# Patient Record
Sex: Female | Born: 1935 | Race: Black or African American | Hispanic: No | Marital: Single | Smoking: Never smoker
Health system: Southern US, Community
[De-identification: ages and names within clinical notes are randomized; demographics above are authoritative.]

## PROBLEM LIST (undated history)

## (undated) DIAGNOSIS — E785 Hyperlipidemia, unspecified: Secondary | ICD-10-CM

## (undated) DIAGNOSIS — I10 Essential (primary) hypertension: Secondary | ICD-10-CM

## (undated) DIAGNOSIS — T7840XA Allergy, unspecified, initial encounter: Secondary | ICD-10-CM

## (undated) HISTORY — DX: Hyperlipidemia, unspecified: E78.5

## (undated) HISTORY — PX: BREAST BIOPSY: SHX20

## (undated) HISTORY — DX: Allergy, unspecified, initial encounter: T78.40XA

## (undated) HISTORY — DX: Essential (primary) hypertension: I10

---

## 2005-07-26 HISTORY — PX: ABDOMINAL HYSTERECTOMY: SHX81

## 2008-10-30 ENCOUNTER — Encounter: Payer: Self-pay | Admitting: Cardiovascular Disease

## 2009-02-12 ENCOUNTER — Encounter: Payer: Self-pay | Admitting: Cardiovascular Disease

## 2009-02-14 ENCOUNTER — Encounter: Payer: Self-pay | Admitting: Cardiovascular Disease

## 2009-02-17 ENCOUNTER — Ambulatory Visit: Payer: Self-pay | Admitting: Cardiovascular Disease

## 2009-02-17 DIAGNOSIS — I10 Essential (primary) hypertension: Secondary | ICD-10-CM | POA: Insufficient documentation

## 2009-02-17 DIAGNOSIS — R001 Bradycardia, unspecified: Secondary | ICD-10-CM

## 2009-02-17 DIAGNOSIS — E785 Hyperlipidemia, unspecified: Secondary | ICD-10-CM

## 2009-02-17 DIAGNOSIS — R011 Cardiac murmur, unspecified: Secondary | ICD-10-CM

## 2012-10-24 ENCOUNTER — Encounter: Payer: Self-pay | Admitting: Nurse Practitioner

## 2012-10-26 ENCOUNTER — Other Ambulatory Visit: Payer: Self-pay | Admitting: *Deleted

## 2012-10-26 MED ORDER — RAMIPRIL 5 MG PO CAPS: 5.0000 mg | ORAL_CAPSULE | Freq: Every day | ORAL | Status: DC

## 2012-12-25 ENCOUNTER — Other Ambulatory Visit: Payer: Self-pay | Admitting: Nurse Practitioner

## 2012-12-26 NOTE — Telephone Encounter (Signed)
Last seen 11/13.

## 2013-01-04 ENCOUNTER — Other Ambulatory Visit: Payer: Self-pay | Admitting: *Deleted

## 2013-01-04 MED ORDER — SIMVASTATIN 20 MG PO TABS: 20.0000 mg | ORAL_TABLET | Freq: Every day | ORAL | Status: DC

## 2013-01-25 ENCOUNTER — Other Ambulatory Visit: Payer: Self-pay | Admitting: Nurse Practitioner

## 2013-02-05 ENCOUNTER — Other Ambulatory Visit: Payer: Self-pay | Admitting: Nurse Practitioner

## 2013-02-07 NOTE — Telephone Encounter (Signed)
Last seen 05/30/12 and last lipid 05/30/12 MMM

## 2013-02-21 ENCOUNTER — Other Ambulatory Visit: Payer: Self-pay | Admitting: Nurse Practitioner

## 2013-02-23 NOTE — Telephone Encounter (Signed)
Last seen 05/30/12 MMM

## 2013-03-07 ENCOUNTER — Other Ambulatory Visit: Payer: Self-pay | Admitting: Nurse Practitioner

## 2013-03-27 ENCOUNTER — Other Ambulatory Visit: Payer: Self-pay | Admitting: Nurse Practitioner

## 2013-04-06 ENCOUNTER — Other Ambulatory Visit: Payer: Self-pay | Admitting: Nurse Practitioner

## 2013-04-09 NOTE — Telephone Encounter (Signed)
NTBS for zocor refill

## 2013-04-09 NOTE — Telephone Encounter (Signed)
Last lipids 05/30/12  MMM

## 2013-04-10 ENCOUNTER — Other Ambulatory Visit: Payer: Self-pay | Admitting: Nurse Practitioner

## 2013-04-11 ENCOUNTER — Encounter: Payer: Self-pay | Admitting: Nurse Practitioner

## 2013-04-11 ENCOUNTER — Other Ambulatory Visit: Payer: Self-pay | Admitting: Nurse Practitioner

## 2013-04-11 ENCOUNTER — Ambulatory Visit (INDEPENDENT_AMBULATORY_CARE_PROVIDER_SITE_OTHER): Payer: Medicare HMO | Admitting: Nurse Practitioner

## 2013-04-11 VITALS — BP 128/74 | HR 62 | Temp 97.9°F | Wt 176.0 lb

## 2013-04-11 DIAGNOSIS — E785 Hyperlipidemia, unspecified: Secondary | ICD-10-CM

## 2013-04-11 DIAGNOSIS — E559 Vitamin D deficiency, unspecified: Secondary | ICD-10-CM

## 2013-04-11 DIAGNOSIS — I10 Essential (primary) hypertension: Secondary | ICD-10-CM

## 2013-04-11 MED ORDER — SIMVASTATIN 20 MG PO TABS: 20.0000 mg | ORAL_TABLET | Freq: Every day | ORAL | Status: DC

## 2013-04-11 MED ORDER — RAMIPRIL 5 MG PO CAPS: 5.0000 mg | ORAL_CAPSULE | Freq: Every day | ORAL | Status: DC

## 2013-04-11 NOTE — Patient Instructions (Addendum)

## 2013-04-11 NOTE — Progress Notes (Signed)
  Subjective:    Patient ID: Mariah Fletcher, female    DOB: 1935/07/03, 78 y.o.   MRN: [id number]  Hypertension This is a chronic problem. The current episode started more than 1 year ago. The problem is unchanged. The problem is controlled. Pertinent negatives include no blurred vision, chest pain, headaches, palpitations, peripheral edema or shortness of breath. There are no associated agents to hypertension. Risk factors for coronary artery disease include dyslipidemia, family history and post-menopausal state. Past treatments include ACE inhibitors. The current treatment provides significant improvement. Compliance problems include diet and exercise.  There is no history of CAD/MI or CVA. There is no history of chronic renal disease.  Hyperlipidemia This is a chronic problem. The current episode started more than 1 year ago. The problem is controlled. Recent lipid tests were reviewed and are normal. She has no history of chronic renal disease, diabetes, hypothyroidism, liver disease, obesity or nephrotic syndrome. There are no known factors aggravating her hyperlipidemia. Pertinent negatives include no chest pain, focal sensory loss, focal weakness, leg pain, myalgias or shortness of breath. Current antihyperlipidemic treatment includes statins. The current treatment provides significant improvement of lipids. Compliance problems include adherence to diet and adherence to exercise.  Risk factors for coronary artery disease include hypertension and post-menopausal.  vitamin d deficiency Vitamin D OTC- no side effects   Review of Systems  Eyes: Negative for blurred vision.  Respiratory: Negative for shortness of breath.   Cardiovascular: Negative for chest pain and palpitations.  Musculoskeletal: Negative for myalgias.  Neurological: Negative for focal weakness and headaches.  All other systems reviewed and are negative.       Objective:   Physical Exam  Constitutional: She is oriented to  person, place, and time. She appears well-developed and well-nourished.  HENT:  Nose: Nose normal.  Mouth/Throat: Oropharynx is clear and moist.  Eyes: EOM are normal.  Neck: Trachea normal, normal range of motion and full passive range of motion without pain. Neck supple. No JVD present. Carotid bruit is not present. No thyromegaly present.  Cardiovascular: Normal rate, regular rhythm, normal heart sounds and intact distal pulses.  Exam reveals no gallop and no friction rub.   No murmur heard. Pulmonary/Chest: Effort normal and breath sounds normal.  Abdominal: Soft. Bowel sounds are normal. She exhibits no distension and no mass. There is no tenderness.  Musculoskeletal: Normal range of motion.  Lymphadenopathy:    She has no cervical adenopathy.  Neurological: She is alert and oriented to person, place, and time. She has normal reflexes.  Skin: Skin is warm and dry.  Psychiatric: She has a normal mood and affect. Her behavior is normal. Judgment and thought content normal.    BP 128/74  Pulse 62  Temp(Src) 97.9 F (36.6 C) (Oral)  Wt 176 lb (79.833 kg)       Assessment & Plan:  1. Hypertension Low NA+ diet - CMP14+EGFR - ramipril (ALTACE) 5 MG capsule; Take 1 capsule (5 mg total) by mouth daily.  Dispense: 30 capsule; Refill: 5  2. Hyperlipidemia LDL goal < 100 Low fat diet an dexercise - NMR, lipoprofile - simvastatin (ZOCOR) 20 MG tablet; Take 1 tablet (20 mg total) by mouth at bedtime.  Dispense: 30 tablet; Refill: 5  3. Unspecified vitamin D deficiency  - Vit D  25 hydroxy (rtn osteoporosis monitoring) Health maintenance reviewed  Mary-Margaret Daphine Deutscher, FNP

## 2013-04-13 LAB — CMP14+EGFR
ALT: 15 IU/L (ref 0–32)
Albumin: 4.3 g/dL (ref 3.5–4.8)
Alkaline Phosphatase: 73 IU/L (ref 39–117)
BUN: 11 mg/dL (ref 8–27)
CO2: 25 mmol/L (ref 18–29)
Chloride: 105 mmol/L (ref 97–108)
Glucose: 69 mg/dL (ref 65–99)
Potassium: 5.2 mmol/L (ref 3.5–5.2)
Total Bilirubin: 1.1 mg/dL (ref 0.0–1.2)
Total Protein: 7.5 g/dL (ref 6.0–8.5)

## 2013-04-13 LAB — NMR, LIPOPROFILE
Cholesterol: 130 mg/dL (ref <200)
LDL Particle Number: 584 nmol/L (ref <1000)
LDL Size: 21 nm (ref >20.5)
LDLC SERPL CALC-MCNC: 61 mg/dL (ref <100)
LP-IR Score: 33 (ref <=45)
Triglycerides by NMR: 91 mg/dL (ref <150)

## 2013-04-26 ENCOUNTER — Other Ambulatory Visit: Payer: Self-pay | Admitting: Nurse Practitioner

## 2013-09-22 ENCOUNTER — Other Ambulatory Visit: Payer: Self-pay | Admitting: Nurse Practitioner

## 2013-10-23 ENCOUNTER — Other Ambulatory Visit: Payer: Self-pay | Admitting: Family Medicine

## 2013-10-24 ENCOUNTER — Other Ambulatory Visit: Payer: Self-pay | Admitting: *Deleted

## 2013-10-24 DIAGNOSIS — E785 Hyperlipidemia, unspecified: Secondary | ICD-10-CM

## 2013-10-24 MED ORDER — SIMVASTATIN 20 MG PO TABS: 20.0000 mg | ORAL_TABLET | Freq: Every day | ORAL | Status: DC

## 2013-12-07 ENCOUNTER — Encounter: Payer: Self-pay | Admitting: Nurse Practitioner

## 2013-12-07 ENCOUNTER — Ambulatory Visit (INDEPENDENT_AMBULATORY_CARE_PROVIDER_SITE_OTHER): Payer: Medicare HMO | Admitting: Nurse Practitioner

## 2013-12-07 VITALS — BP 138/81 | HR 55 | Temp 98.1°F | Ht 64.0 in | Wt 179.2 lb

## 2013-12-07 DIAGNOSIS — L723 Sebaceous cyst: Secondary | ICD-10-CM

## 2013-12-07 DIAGNOSIS — E785 Hyperlipidemia, unspecified: Secondary | ICD-10-CM

## 2013-12-07 DIAGNOSIS — I10 Essential (primary) hypertension: Secondary | ICD-10-CM

## 2013-12-07 DIAGNOSIS — Z13820 Encounter for screening for osteoporosis: Secondary | ICD-10-CM

## 2013-12-07 MED ORDER — SIMVASTATIN 20 MG PO TABS: 20.0000 mg | ORAL_TABLET | Freq: Every day | ORAL | Status: DC

## 2013-12-07 MED ORDER — RAMIPRIL 5 MG PO CAPS: 5.0000 mg | ORAL_CAPSULE | Freq: Every day | ORAL | Status: DC

## 2013-12-07 NOTE — Progress Notes (Signed)
Subjective:    Patient ID: Mariah Fletcher, female    DOB: Feb 08, 1935, 79 y.o.   MRN: [id number]  Patient here today for follow up of chronic medical problems.  Hypertension This is a chronic problem. The current episode started more than 1 year ago. The problem is unchanged. The problem is controlled. Pertinent negatives include no blurred vision, chest pain, headaches, palpitations, peripheral edema or shortness of breath. There are no associated agents to hypertension. Risk factors for coronary artery disease include dyslipidemia, family history and post-menopausal state. Past treatments include ACE inhibitors. The current treatment provides significant improvement. Compliance problems include diet and exercise.  There is no history of CAD/MI or CVA. There is no history of chronic renal disease.  Hyperlipidemia This is a chronic problem. The current episode started more than 1 year ago. The problem is controlled. Recent lipid tests were reviewed and are normal. She has no history of chronic renal disease, diabetes, hypothyroidism, liver disease, obesity or nephrotic syndrome. There are no known factors aggravating her hyperlipidemia. Pertinent negatives include no chest pain, focal sensory loss, focal weakness, leg pain, myalgias or shortness of breath. Current antihyperlipidemic treatment includes statins. The current treatment provides significant improvement of lipids. Compliance problems include adherence to diet and adherence to exercise.  Risk factors for coronary artery disease include hypertension and post-menopausal.  vitamin d deficiency Vitamin D OTC- no side effects   Review of Systems  Eyes: Negative for blurred vision.  Respiratory: Negative for shortness of breath.   Cardiovascular: Negative for chest pain and palpitations.  Musculoskeletal: Negative for myalgias.  Neurological: Negative for focal weakness and headaches.  All other systems reviewed and are negative.       Objective:   Physical Exam  Constitutional: She is oriented to person, place, and time. She appears well-developed and well-nourished.  HENT:  Nose: Nose normal.  Mouth/Throat: Oropharynx is clear and moist.  Eyes: EOM are normal.  Neck: Trachea normal, normal range of motion and full passive range of motion without pain. Neck supple. No JVD present. Carotid bruit is not present. No thyromegaly present.  Cardiovascular: Normal rate, regular rhythm, normal heart sounds and intact distal pulses.  Exam reveals no gallop and no friction rub.   No murmur heard. Pulmonary/Chest: Effort normal and breath sounds normal.  Abdominal: Soft. Bowel sounds are normal. She exhibits no distension and no mass. There is no tenderness.  Musculoskeletal: Normal range of motion.  Lymphadenopathy:    She has no cervical adenopathy.  Neurological: She is alert and oriented to person, place, and time. She has normal reflexes.  Skin: Skin is warm and dry.  Large sebaceous cyst right upper back   Psychiatric: She has a normal mood and affect. Her behavior is normal. Judgment and thought content normal.    BP 138/81  Pulse 55  Temp(Src) 98.1 F (36.7 C) (Oral)  Ht '5\' 4"'  (1.626 m)  Wt 179 lb 3.2 oz (81.285 kg)  BMI 30.74 kg/m2       Assessment & Plan:  1. Hyperlipidemia LDL goal < 100 Low fat diet and erercise - NMR, lipoprofile - simvastatin (ZOCOR) 20 MG tablet; Take 1 tablet (20 mg total) by mouth at bedtime.  Dispense: 90 tablet; Refill: 1  2. Hypertension *2low Na+ diet** - CMP14+EGFR - ramipril (ALTACE) 5 MG capsule; Take 1 capsule (5 mg total) by mouth daily.  Dispense: 90 capsule; Refill: 1  3. Sebaceous cyst Make appointment for removal Do not pick  Or scratch at  area  Health maintenance reviewed Labs pending Follow up in 3 months  Bier, FNP

## 2013-12-07 NOTE — Patient Instructions (Signed)

## 2013-12-10 LAB — CMP14+EGFR
A/G RATIO: 1.4 (ref 1.1–2.5)
ALBUMIN: 4.2 g/dL (ref 3.5–4.8)
ALT: 9 IU/L (ref 0–32)
AST: 15 IU/L (ref 0–40)
Alkaline Phosphatase: 63 IU/L (ref 39–117)
BILIRUBIN TOTAL: 0.7 mg/dL (ref 0.0–1.2)
BUN/Creatinine Ratio: 9 — ABNORMAL LOW (ref 11–26)
BUN: 7 mg/dL — ABNORMAL LOW (ref 8–27)
CO2: 27 mmol/L (ref 18–29)
Calcium: 11.5 mg/dL — ABNORMAL HIGH (ref 8.7–10.3)
Chloride: 104 mmol/L (ref 97–108)
Creatinine, Ser: 0.76 mg/dL (ref 0.57–1.00)
GFR, EST AFRICAN AMERICAN: 88 mL/min/{1.73_m2} (ref >59)
GFR, EST NON AFRICAN AMERICAN: 76 mL/min/{1.73_m2} (ref >59)
GLOBULIN, TOTAL: 3.1 g/dL (ref 1.5–4.5)
GLUCOSE: 88 mg/dL (ref 65–99)
POTASSIUM: 5.6 mmol/L — AB (ref 3.5–5.2)
Sodium: 143 mmol/L (ref 134–144)
TOTAL PROTEIN: 7.3 g/dL (ref 6.0–8.5)

## 2013-12-10 LAB — NMR, LIPOPROFILE
CHOLESTEROL: 119 mg/dL (ref 100–199)
HDL Cholesterol by NMR: 57 mg/dL (ref >39)
HDL PARTICLE NUMBER: 27.1 umol/L — AB (ref >=30.5)
LDL Particle Number: <300 nmol/L (ref <1000)
LDLC SERPL CALC-MCNC: 40 mg/dL (ref 0–99)
LP-IR Score: 29 (ref <=45)
SMALL LDL PARTICLE NUMBER: 97 nmol/L (ref <=527)
TRIGLYCERIDES BY NMR: 109 mg/dL (ref 0–149)

## 2013-12-24 ENCOUNTER — Ambulatory Visit (INDEPENDENT_AMBULATORY_CARE_PROVIDER_SITE_OTHER): Payer: Medicare HMO | Admitting: *Deleted

## 2013-12-24 DIAGNOSIS — E875 Hyperkalemia: Secondary | ICD-10-CM

## 2013-12-25 LAB — BMP8+EGFR
BUN / CREAT RATIO: 21 (ref 11–26)
BUN: 14 mg/dL (ref 8–27)
CHLORIDE: 106 mmol/L (ref 97–108)
CO2: 25 mmol/L (ref 18–29)
Calcium: 11.2 mg/dL — ABNORMAL HIGH (ref 8.7–10.3)
Creatinine, Ser: 0.67 mg/dL (ref 0.57–1.00)
GFR calc Af Amer: 99 mL/min/{1.73_m2} (ref >59)
GFR calc non Af Amer: 86 mL/min/{1.73_m2} (ref >59)
Glucose: 89 mg/dL (ref 65–99)
POTASSIUM: 5 mmol/L (ref 3.5–5.2)
SODIUM: 141 mmol/L (ref 134–144)

## 2014-01-16 ENCOUNTER — Encounter: Payer: Self-pay | Admitting: Nurse Practitioner

## 2014-01-16 ENCOUNTER — Ambulatory Visit: Payer: Medicare HMO | Admitting: Nurse Practitioner

## 2014-01-16 ENCOUNTER — Ambulatory Visit (INDEPENDENT_AMBULATORY_CARE_PROVIDER_SITE_OTHER): Payer: Medicare HMO | Admitting: Nurse Practitioner

## 2014-01-16 VITALS — BP 134/80 | HR 45 | Temp 98.7°F | Ht 64.0 in | Wt 179.2 lb

## 2014-01-16 DIAGNOSIS — L723 Sebaceous cyst: Secondary | ICD-10-CM

## 2014-01-16 MED ORDER — CEPHALEXIN 500 MG PO CAPS: 500.0000 mg | ORAL_CAPSULE | Freq: Three times a day (TID) | ORAL | Status: DC

## 2014-01-16 NOTE — Progress Notes (Signed)
   Subjective:    Patient ID: Mariah Fletcher, female    DOB: 07/06/1935, 79 y.o.   MRN: [id number]  HPI Patient here today for removal of sebaceous cYst on back- Has bee there for awhile and has been getting larger. Rubs on her bra strap.    Review of Systems  All other systems reviewed and are negative.      Objective:   Physical Exam  Constitutional: She is oriented to person, place, and time. She appears well-developed and well-nourished. No distress.  Cardiovascular: Normal rate and normal heart sounds.   Pulmonary/Chest: Effort normal and breath sounds normal.  Neurological: She is alert and oriented to person, place, and time.  Skin:  3 cm cystic lesion with central blackened area in center.  Psychiatric: She has a normal mood and affect. Her behavior is normal. Judgment and thought content normal.    Procedure;  Lidocaine 2% with - 4ml local  Cleaned with betadine  #11 blade to make incision  Copious amounts of foul smelling blck cheesy like substance removed  Iodoform packing   tegaderm dressing      Assessment & Plan:   1. Sebaceous cyst    Wound care discussed Meds ordered this encounter  Medications  . cephALEXin (KEFLEX) 500 MG capsule    Sig: Take 1 capsule (500 mg total) by mouth 3 (three) times daily.    Dispense:  30 capsule    Refill:  0    Order Specific Question:  Supervising Provider    Answer:  Deborra MedinaMOORE, DONALD W [[id number]]   RTO MOnday to change packing  Mary-Margaret Daphine DeutscherMartin, FNP

## 2014-01-21 ENCOUNTER — Ambulatory Visit (INDEPENDENT_AMBULATORY_CARE_PROVIDER_SITE_OTHER): Payer: Medicare HMO | Admitting: Nurse Practitioner

## 2014-01-21 ENCOUNTER — Encounter: Payer: Self-pay | Admitting: Nurse Practitioner

## 2014-01-21 VITALS — BP 132/76 | HR 53 | Temp 97.9°F | Ht 64.0 in | Wt 180.2 lb

## 2014-01-21 DIAGNOSIS — Z4800 Encounter for change or removal of nonsurgical wound dressing: Secondary | ICD-10-CM

## 2014-01-21 NOTE — Progress Notes (Signed)
   Subjective:    Patient ID: Mariah Fletcher, female    DOB: 1934/08/04, 79 y.o.   MRN: [id number]  HPI Patient had a large sebaceous cyst removed form mid upper back last week and is here today to have it repacked.    Review of Systems  Constitutional: Negative.   HENT: Negative.   Respiratory: Negative.   Cardiovascular: Negative.   Genitourinary: Negative.   Neurological: Negative.   Psychiatric/Behavioral: Negative.   All other systems reviewed and are negative.      Objective:   Physical Exam  Constitutional: She appears well-developed and well-nourished.  Cardiovascular: Normal rate, regular rhythm and normal heart sounds.   Pulmonary/Chest: Effort normal and breath sounds normal.  Skin: Skin is warm.  Wound clean- no sign of infection- changed packing today    BP 132/76  Pulse 53  Temp(Src) 97.9 F (36.6 C) (Oral)  Ht 5\' 4"  (1.626 m)  Wt 180 lb 3.2 oz (81.738 kg)  BMI 30.92 kg/m2       Assessment & Plan:   1. Change or removal of wound packing    Patient to pull a small amount of packing out daily Keep covered Follow up in 1 week  Mariah Daphine DeutscherMartin, FNP

## 2014-01-21 NOTE — Progress Notes (Signed)
   Subjective:    Patient ID: Mariah Fletcher, female    DOB: [birth year], 79 y.o.   MRN: [id number]  HPI Labs only   Review of Systems     Objective:   Physical Exam        Assessment & Plan:

## 2014-01-31 ENCOUNTER — Encounter: Payer: Self-pay | Admitting: Nurse Practitioner

## 2014-01-31 ENCOUNTER — Ambulatory Visit (INDEPENDENT_AMBULATORY_CARE_PROVIDER_SITE_OTHER): Payer: Medicare HMO | Admitting: Nurse Practitioner

## 2014-01-31 VITALS — BP 134/80 | HR 53 | Temp 98.7°F | Ht 64.0 in | Wt 177.0 lb

## 2014-01-31 DIAGNOSIS — T07XXXA Unspecified multiple injuries, initial encounter: Secondary | ICD-10-CM

## 2014-01-31 DIAGNOSIS — T148XXD Other injury of unspecified body region, subsequent encounter: Secondary | ICD-10-CM

## 2014-01-31 NOTE — Progress Notes (Signed)
   Subjective:    Patient ID: Mariah Fletcher, female    DOB: 07/19/1935, 79 y.o.   MRN: [id number]  HPI Patient in for recheck of removal of sebaceous cyst that has had packing in it. The packing completely came out on Tuesday of this week. SHe says that it not bothering her an dthat there has been no drainage.    Review of Systems  All other systems reviewed and are negative.      Objective:   Physical Exam  Constitutional: She appears well-developed and well-nourished.  Cardiovascular: Normal rate, regular rhythm and normal heart sounds.   Pulmonary/Chest: Effort normal and breath sounds normal.  Skin: Skin is warm and dry.  Wound filling in nicely without any sign of infection.   Procedure- cleaned with peroxide and bandaid applied       Assessment & Plan:   1. Delayed wound healing    Continue to clean daily with antibacterial soap Keep covered Watch for signs of infection RTO prn  Mary-Margaret Daphine DeutscherMartin, FNP

## 2014-01-31 NOTE — Patient Instructions (Signed)
Continue to clean daily with antibacterial soap Keep covered Watch for signs of infection RTO prn

## 2014-02-20 ENCOUNTER — Ambulatory Visit (INDEPENDENT_AMBULATORY_CARE_PROVIDER_SITE_OTHER): Payer: Medicare HMO | Admitting: Pharmacist

## 2014-02-20 ENCOUNTER — Encounter: Payer: Self-pay | Admitting: Pharmacist

## 2014-02-20 ENCOUNTER — Ambulatory Visit (INDEPENDENT_AMBULATORY_CARE_PROVIDER_SITE_OTHER): Payer: Medicare HMO

## 2014-02-20 VITALS — Ht 61.0 in | Wt 176.0 lb

## 2014-02-20 DIAGNOSIS — M8580 Other specified disorders of bone density and structure, unspecified site: Secondary | ICD-10-CM | POA: Insufficient documentation

## 2014-02-20 DIAGNOSIS — M899 Disorder of bone, unspecified: Secondary | ICD-10-CM

## 2014-02-20 DIAGNOSIS — M949 Disorder of cartilage, unspecified: Secondary | ICD-10-CM

## 2014-02-20 DIAGNOSIS — Z13820 Encounter for screening for osteoporosis: Secondary | ICD-10-CM

## 2014-02-20 MED ORDER — VITAMIN D 1000 UNITS PO TABS: 1000.0000 [IU] | ORAL_TABLET | Freq: Every day | ORAL | Status: AC

## 2014-02-20 NOTE — Progress Notes (Addendum)
Patient ID: Mariah Fletcher, female   DOB: [birth year], 79 y.o.   MRN: [id number] [facility] Current Height: Height: 5\' 1"  (154.9 cm)      Max Lifetime Height:  5\' 2"  Current Weight: Weight: 176 lb (79.833 kg)       Ethnicity:African American  BP:       HR:         HPI: Does pt already have a diagnosis of:  Osteopenia?  Yes Osteoporosis?  No  Back Pain?  No       Kyphosis?  No Prior fracture?  No Med(s) for Osteoporosis/Osteopenia:  none Med(s) previously tried for Osteoporosis/Osteopenia:  none                                                             PMH: Age at menopause:  Last 5940's  Hysterectomy?  Yes Oophorectomy?  Yes HRT? No Steroid Use?  No Thyroid med?  No History of cancer?  No History of digestive disorders (ie Crohn's)?  Yes Current or previous eating disorders?  No Last Vitamin D Result:  37.9 (04/11/2013) Last GFR Result:  88 (12/07/2013) Last Calcium Result:  11.2 (elevated (01/07/2014) - down from 11.5 (12/07/2013)   FH/SH: Family history of osteoporosis?  Yes - sister Parent with history of hip fracture?  No Family history of breast cancer? No Exercise?  No - not regularly but 1-2 times per week she walks Smoking?  No Alcohol?  No    Calcium Assessment Calcium Intake  # of servings/day  Calcium mg  Milk (8 oz) 0  x  300  = 0  Yogurt (4 oz) 1 x  200 = 200mg   Cheese (1 oz) 1 x  200 = 200mg   Other Calcium sources   250mg   Ca supplement 0 = 0   Estimated calcium intake per day 650mg    **patient has history of hypercalcemia** DEXA Results Date of Test T-Score for AP Spine L1-L4 T-Score for Total Left Hip T-Score Neck of left hip T-Score for Total Right Hip T-Score Neck of Right Hip  02/20/2014 -0.3 -0.2 -1.4 -0.3 -1.0  03/17/2011 -0.5 0.0 -1.3 -0.1 -0.8  12/13/2005 -0.6 0.6 -0.6 0.3 -0.3                 FRAX 10 year estimate: Total FX risk:  11%  (consider medication if >/= 20%) Hip FX risk:  2.2%  (consider medication if >/=  3%)  Assessment: Osteopenia - low fracture risk hypercalcemia  Recommendations: 1.  Discussed DEXA results and fracture risk 2.  recommend no change in current calcium intake until can further evaluate cause of hypercalcemia   3.  recommend weight bearing exercise - 30 minutes at least 4 days per week.   4.  Counseled and educated about fall risk and prevention. 5. Decrease vitamin D to 1000IU daily due to elevated calcium 6.  Orders Placed This Encounter  Procedures  . PTH, Intact and Calcium     Recheck DEXA:  2 years  Time spent counseling patient:  25 minutes  Henrene Pastorammy Mariah Fletcher, PharmD, CPP         Labs showed continued hypercalcemia and hyperparathyroidism.  This condition was last evaluated in 2008 by Dr Talmage NapBalan.   Referral to endocrinologist made for re-evaluation.

## 2014-02-20 NOTE — Patient Instructions (Signed)
Decreased vitamin D to 1000 units daily (or 2000 units every other day until you finish what you have at home)               Exercise for Strong Bones  Exercise is important to build and maintain strong bones / bone density.  There are 2 types of exercises that are important to building and maintaining strong bones:  Weight- bearing and muscle-stregthening.  Weight-bearing Exercises  These exercises include activities that make you move against gravity while staying upright. Weight-bearing exercises can be high-impact or low-impact.  High-impact weight-bearing exercises help build bones and keep them strong. If you have broken a bone due to osteoporosis or are at risk of breaking a bone, you may need to avoid high-impact exercises. If you're not sure, you should check with your healthcare provider.  Examples of high-impact weight-bearing exercises are: Dancing  Doing high-impact aerobics  Hiking  Jogging/running  Jumping Rope  Stair climbing  Tennis  Low-impact weight-bearing exercises can also help keep bones strong and are a safe alternative if you cannot do high-impact exercises.   Examples of low-impact weight-bearing exercises are: Using elliptical training machines  Doing low-impact aerobics  Using stair-step machines  Fast walking on a treadmill or outside   Muscle-Strengthening Exercises These exercises include activities where you move your body, a weight or some other resistance against gravity. They are also known as resistance exercises and include: Lifting weights  Using elastic exercise bands  Using weight machines  Lifting your own body weight  Functional movements, such as standing and rising up on your toes  Yoga and Pilates can also improve strength, balance and flexibility. However, certain positions may not be safe for people with osteoporosis or those at increased risk of broken bones. For example, exercises that have you bend forward may increase the chance  of breaking a bone in the spine.   Non-Impact Exercises There are other types of exercises that can help prevent falls.  Non-impact exercises can help you to improve balance, posture and how well you move in everyday activities. Some of these exercises include: Balance exercises that strengthen your legs and test your balance, such as Tai Chi, can decrease your risk of falls.  Posture exercises that improve your posture and reduce rounded or "sloping" shoulders can help you decrease the chance of breaking a bone, especially in the spine.  Functional exercises that improve how well you move can help you with everyday activities and decrease your chance of falling and breaking a bone. For example, if you have trouble getting up from a chair or climbing stairs, you should do these activities as exercises.   **A physical therapist can teach you balance, posture and functional exercises. He/she can also help you learn which exercises are safe and appropriate for you.  [facility] has a physical therapy office in [place] in front of our office and referrals can be made for assessments and treatment as needed and strength and balance training.  If you would like to have an assessment with [facility] and our physical therapy team please let a nurse or provider know.  Fall Prevention and Home Safety Falls cause injuries and can affect all age groups. It is possible to use preventive measures to significantly decrease the likelihood of falls. There are many simple measures which can make your home safer and prevent falls. OUTDOORS  Repair cracks and edges of walkways and driveways.  Remove high doorway thresholds.  Trim shrubbery on the  main path into your home.  Have good outside lighting.  Clear walkways of tools, rocks, debris, and clutter.  Check that handrails are not broken and are securely fastened. Both sides of steps should have handrails.  Have leaves, snow, and ice cleared regularly.  Use  sand or salt on walkways during winter months.  In the garage, clean up grease or oil spills. BATHROOM  Install night lights.  Install grab bars by the toilet and in the tub and shower.  Use non-skid mats or decals in the tub or shower.  Place a plastic non-slip stool in the shower to sit on, if needed.  Keep floors dry and clean up all water on the floor immediately.  Remove soap buildup in the tub or shower on a regular basis.  Secure bath mats with non-slip, double-sided rug tape.  Remove throw rugs and tripping hazards from the floors. BEDROOMS  Install night lights.  Make sure a bedside light is easy to reach.  Do not use oversized bedding.  Keep a telephone by your bedside.  Have a firm chair with side arms to use for getting dressed.  Remove throw rugs and tripping hazards from the floor. KITCHEN  Keep handles on pots and pans turned toward the center of the stove. Use back burners when possible.  Clean up spills quickly and allow time for drying.  Avoid walking on wet floors.  Avoid hot utensils and knives.  Position shelves so they are not too high or low.  Place commonly used objects within easy reach.  If necessary, use a sturdy step stool with a grab bar when reaching.  Keep electrical cables out of the way.  Do not use floor polish or wax that makes floors slippery. If you must use wax, use non-skid floor wax.  Remove throw rugs and tripping hazards from the floor. STAIRWAYS  Never leave objects on stairs.  Place handrails on both sides of stairways and use them. Fix any loose handrails. Make sure handrails on both sides of the stairways are as long as the stairs.  Check carpeting to make sure it is firmly attached along stairs. Make repairs to worn or loose carpet promptly.  Avoid placing throw rugs at the top or bottom of stairways, or properly secure the rug with carpet tape to prevent slippage. Get rid of throw rugs, if possible.  Have  an electrician put in a light switch at the top and bottom of the stairs. OTHER FALL PREVENTION TIPS  Wear low-heel or rubber-soled shoes that are supportive and fit well. Wear closed toe shoes.  When using a stepladder, make sure it is fully opened and both spreaders are firmly locked. Do not climb a closed stepladder.  Add color or contrast paint or tape to grab bars and handrails in your home. Place contrasting color strips on first and last steps.  Learn and use mobility aids as needed. Install an electrical emergency response system.  Turn on lights to avoid dark areas. Replace light bulbs that burn out immediately. Get light switches that glow.  Arrange furniture to create clear pathways. Keep furniture in the same place.  Firmly attach carpet with non-skid or double-sided tape.  Eliminate uneven floor surfaces.  Select a carpet pattern that does not visually hide the edge of steps.  Be aware of all pets. OTHER HOME SAFETY TIPS  Set the water temperature for 120 F (48.8 C).  Keep emergency numbers on or near the telephone.  Keep smoke detectors  on every level of the home and near sleeping areas. Document Released: 07/02/2002 Document Revised: 01/11/2012 Document Reviewed: 10/01/2011 [facility] Patient Information 2015 [place], [place]. This information is not intended to replace advice given to you by your health care provider. Make sure you discuss any questions you have with your health care provider.

## 2014-02-21 LAB — PTH, INTACT AND CALCIUM
CALCIUM: 11.4 mg/dL — AB (ref 8.7–10.3)
PTH: 67 pg/mL — ABNORMAL HIGH (ref 15–65)

## 2014-02-28 ENCOUNTER — Encounter: Payer: Self-pay | Admitting: Pharmacist

## 2014-02-28 ENCOUNTER — Other Ambulatory Visit: Payer: Self-pay | Admitting: Pharmacist

## 2014-02-28 DIAGNOSIS — E210 Primary hyperparathyroidism: Secondary | ICD-10-CM

## 2014-02-28 DIAGNOSIS — M8580 Other specified disorders of bone density and structure, unspecified site: Secondary | ICD-10-CM

## 2014-03-11 ENCOUNTER — Encounter: Payer: Self-pay | Admitting: Nurse Practitioner

## 2014-03-11 ENCOUNTER — Ambulatory Visit (INDEPENDENT_AMBULATORY_CARE_PROVIDER_SITE_OTHER): Payer: Medicare HMO | Admitting: Nurse Practitioner

## 2014-03-11 VITALS — BP 120/78 | Temp 96.9°F | Ht 61.0 in | Wt 177.0 lb

## 2014-03-11 DIAGNOSIS — I10 Essential (primary) hypertension: Secondary | ICD-10-CM

## 2014-03-11 DIAGNOSIS — E785 Hyperlipidemia, unspecified: Secondary | ICD-10-CM

## 2014-03-11 DIAGNOSIS — M949 Disorder of cartilage, unspecified: Secondary | ICD-10-CM

## 2014-03-11 DIAGNOSIS — Z6833 Body mass index (BMI) 33.0-33.9, adult: Secondary | ICD-10-CM

## 2014-03-11 DIAGNOSIS — E210 Primary hyperparathyroidism: Secondary | ICD-10-CM

## 2014-03-11 DIAGNOSIS — M899 Disorder of bone, unspecified: Secondary | ICD-10-CM

## 2014-03-11 DIAGNOSIS — M8580 Other specified disorders of bone density and structure, unspecified site: Secondary | ICD-10-CM

## 2014-03-11 DIAGNOSIS — Z713 Dietary counseling and surveillance: Secondary | ICD-10-CM

## 2014-03-11 NOTE — Progress Notes (Signed)
Subjective:    Patient ID: Mariah Fletcher, female    DOB: [birth year], 79 y.o.   MRN: [id number]  Patient here today for follow up of chronic medical problems. SHe has  No complaints today  Hypertension This is a chronic problem. The current episode started more than 1 year ago. The problem is unchanged. The problem is controlled. Pertinent negatives include no blurred vision, chest pain, headaches, palpitations, peripheral edema or shortness of breath. There are no associated agents to hypertension. Risk factors for coronary artery disease include dyslipidemia, family history and post-menopausal state. Past treatments include ACE inhibitors. The current treatment provides significant improvement. Compliance problems include diet and exercise.  There is no history of CAD/MI or CVA. There is no history of chronic renal disease.  Hyperlipidemia This is a chronic problem. The current episode started more than 1 year ago. The problem is controlled. Recent lipid tests were reviewed and are normal. She has no history of chronic renal disease, diabetes, hypothyroidism, liver disease, obesity or nephrotic syndrome. There are no known factors aggravating her hyperlipidemia. Pertinent negatives include no chest pain, focal sensory loss, focal weakness, leg pain, myalgias or shortness of breath. Current antihyperlipidemic treatment includes statins. The current treatment provides significant improvement of lipids. Compliance problems include adherence to diet and adherence to exercise.  Risk factors for coronary artery disease include hypertension and post-menopausal.  vitamin d deficiency Vitamin D OTC- no side effects hyperparathyroidism On no meds- just watching     Review of Systems  Eyes: Negative for blurred vision.  Respiratory: Negative for shortness of breath.   Cardiovascular: Negative for chest pain and palpitations.  Musculoskeletal: Negative for myalgias.  Neurological: Negative for focal  weakness and headaches.  All other systems reviewed and are negative.      Objective:   Physical Exam  Constitutional: She is oriented to person, place, and time. She appears well-developed and well-nourished.  HENT:  Nose: Nose normal.  Mouth/Throat: Oropharynx is clear and moist.  Eyes: EOM are normal.  Neck: Trachea normal, normal range of motion and full passive range of motion without pain. Neck supple. No JVD present. Carotid bruit is not present. No thyromegaly present.  Cardiovascular: Normal rate, regular rhythm, normal heart sounds and intact distal pulses.  Exam reveals no gallop and no friction rub.   No murmur heard. Pulmonary/Chest: Effort normal and breath sounds normal.  Abdominal: Soft. Bowel sounds are normal. She exhibits no distension and no mass. There is no tenderness.  Musculoskeletal: Normal range of motion.  Lymphadenopathy:    She has no cervical adenopathy.  Neurological: She is alert and oriented to person, place, and time. She has normal reflexes.  Skin: Skin is warm and dry.     Psychiatric: She has a normal mood and affect. Her behavior is normal. Judgment and thought content normal.    BP 120/78  Temp(Src) 96.9 F (36.1 C) (Oral)  Ht '5\' 1"'  (1.549 m)  Wt 177 lb (80.287 kg)  BMI 33.46 kg/m2       Assessment & Plan:   1. Osteopenia   2. Hyperparathyroidism, primary   3. Hyperlipidemia with target LDL less than 100   4. Hypercalcemia   5. Essential hypertension, benign   6. BMI 33.0-33.9,adult   7. Weight loss counseling, encounter for    Orders Placed This Encounter  Procedures  . CMP14+EGFR  . NMR, lipoprofile    Labs pending Health maintenance reviewed Diet and exercise encouraged Continue all meds Follow up  In  3 months    [place], FNP

## 2014-03-11 NOTE — Patient Instructions (Signed)

## 2014-03-12 LAB — CMP14+EGFR
ALBUMIN: 4.3 g/dL (ref 3.5–4.8)
ALT: 9 IU/L (ref 0–32)
AST: 13 IU/L (ref 0–40)
Albumin/Globulin Ratio: 1.4 (ref 1.1–2.5)
Alkaline Phosphatase: 64 IU/L (ref 39–117)
BILIRUBIN TOTAL: 0.8 mg/dL (ref 0.0–1.2)
BUN/Creatinine Ratio: 14 (ref 11–26)
BUN: 10 mg/dL (ref 8–27)
CHLORIDE: 107 mmol/L (ref 97–108)
CO2: 23 mmol/L (ref 18–29)
CREATININE: 0.73 mg/dL (ref 0.57–1.00)
Calcium: 11.4 mg/dL — ABNORMAL HIGH (ref 8.7–10.3)
GFR calc non Af Amer: 80 mL/min/{1.73_m2} (ref >59)
GFR, EST AFRICAN AMERICAN: 92 mL/min/{1.73_m2} (ref >59)
GLOBULIN, TOTAL: 3.1 g/dL (ref 1.5–4.5)
GLUCOSE: 91 mg/dL (ref 65–99)
Potassium: 5.4 mmol/L — ABNORMAL HIGH (ref 3.5–5.2)
Sodium: 144 mmol/L (ref 134–144)
Total Protein: 7.4 g/dL (ref 6.0–8.5)

## 2014-03-12 LAB — NMR, LIPOPROFILE
Cholesterol: 151 mg/dL (ref 100–199)
HDL Cholesterol by NMR: 59 mg/dL (ref >39)
HDL PARTICLE NUMBER: 30.4 umol/L — AB (ref >=30.5)
LDL PARTICLE NUMBER: 426 nmol/L (ref <1000)
LDL Size: 21.1 nm (ref >20.5)
LDLC SERPL CALC-MCNC: 67 mg/dL (ref 0–99)
LP-IR Score: 41 (ref <=45)
Small LDL Particle Number: 96 nmol/L (ref <=527)
TRIGLYCERIDES BY NMR: 126 mg/dL (ref 0–149)

## 2014-03-26 ENCOUNTER — Encounter: Payer: Self-pay | Admitting: Endocrinology

## 2014-03-26 ENCOUNTER — Ambulatory Visit (INDEPENDENT_AMBULATORY_CARE_PROVIDER_SITE_OTHER): Payer: Commercial Managed Care - HMO | Admitting: Endocrinology

## 2014-03-26 VITALS — BP 136/92 | HR 51 | Temp 98.2°F | Ht 61.0 in | Wt 177.0 lb

## 2014-03-26 DIAGNOSIS — E210 Primary hyperparathyroidism: Secondary | ICD-10-CM

## 2014-03-26 DIAGNOSIS — E042 Nontoxic multinodular goiter: Secondary | ICD-10-CM

## 2014-03-26 LAB — TSH: TSH: 2.26 u[IU]/mL (ref 0.350–4.500)

## 2014-03-26 LAB — T4, FREE: Free T4: 0.99 ng/dL (ref 0.80–1.80)

## 2014-03-26 NOTE — Progress Notes (Signed)
Subjective:    Patient ID: Mariah Fletcher, female    DOB: 1934/11/17, 79 y.o.   MRN: [id number]  HPI Pt was first noted to have hypercalcemia in approx 2008.  she has never had urolithiasis, PUD, pancreatitis, osteoprorosis, depression, or bony fracture.  she does not takes vitamin-D, 1000 units per day.  She does not take vitamin-A supplement.  She reports moderate pain at the knees, but no assoc numbness.   Past Medical History  Diagnosis Date  . Hyperlipidemia   . Hypertension   . Allergy     No past surgical history on file.  History   Social History  . Marital Status: Single    Spouse Name: N/A    Number of Children: N/A  . Years of Education: N/A   Occupational History  . Not on file.   Social History Main Topics  . Smoking status: Never Smoker   . Smokeless tobacco: Not on file  . Alcohol Use: Not on file  . Drug Use: Not on file  . Sexual Activity: Not on file   Other Topics Concern  . Not on file   Social History Narrative  . No narrative on file    Current Outpatient Prescriptions on File Prior to Visit  Medication Sig Dispense Refill  . aspirin 81 MG tablet Take 81 mg by mouth daily.      . cholecalciferol (VITAMIN D) 1000 UNITS tablet Take 1 tablet (1,000 Units total) by mouth daily.      . ramipril (ALTACE) 5 MG capsule Take 1 capsule (5 mg total) by mouth daily.  90 capsule  1  . simvastatin (ZOCOR) 20 MG tablet Take 1 tablet (20 mg total) by mouth at bedtime.  90 tablet  1   No current facility-administered medications on file prior to visit.    No Known Allergies  Family History  Problem Relation Age of Onset  . Hyperparathyroidism      neg fam hx    BP 136/92  Pulse 51  Temp(Src) 98.2 F (36.8 C) (Oral)  Ht  (1.549 m)  Wt 177 lb (80.287 kg)  BMI 33.46 kg/m2  SpO2 97%   Review of Systems denies weight loss, galactorrhea, hematuria, memory loss, menopausal sxs, seizure, abdominal pain, muscle weakness, urinary frequency, skin  rash, visual loss, sob, diarrhea, rhinorrhea, easy bruising, and depression.     Objective:   Physical Exam VS: see vs page GEN: no distress HEAD: head: no deformity eyes: no periorbital swelling, no proptosis external nose and ears are normal mouth: no lesion seen NECK: multinodular goiter is noted.  CHEST WALL: no deformity LUNGS:  Clear to auscultation CV: reg rate and rhythm, no murmur ABD: abdomen is soft, nontender.  no hepatosplenomegaly.  not distended.  no hernia MUSCULOSKELETAL: muscle bulk and strength are grossly normal.  no obvious joint swelling.  gait is normal and steady.  No deformity EXTEMITIES: no deformity.  no ulcer on the feet.  feet are of normal color and temp.  no edema PULSES: dorsalis pedis intact bilat.  no carotid bruit NEURO:  cn 2-12 grossly intact.   readily moves all 4's.  sensation is intact to touch on the feet SKIN:  Normal texture and temperature.  No rash or suspicious lesion is visible.   NODES:  None palpable at the neck PSYCH: alert, well-oriented.  Does not appear anxious nor depressed.  Lab Results  Component Value Date   PTH 99* 03/26/2014   CALCIUM 11.1* 03/26/2014  i have reviewed the following outside records: Office notes  Radiology: i reviewed dexa from 7/15:     Assessment & Plan:  Primary hyperparathyroidism, worse.  In view of her h/o Ca++ over 12, she should consider surgery. Multinodular goiter, suggested by physical exam, new.  This needs to be investigated prior to any neck surgery. Knee pain: this could be caused by hyperparathyroidism.     Patient is advised the following: Patient Instructions  blood tests are being requested for you today.  We'll contact you with results. Let's also check the ultrasound.  you will receive a phone call, about a day and time for an appointment

## 2014-03-26 NOTE — Patient Instructions (Addendum)
blood tests are being requested for you today.  We'll contact you with results. Let's also check the ultrasound.  you will receive a phone call, about a day and time for an appointment 

## 2014-03-27 ENCOUNTER — Telehealth: Payer: Self-pay

## 2014-03-27 DIAGNOSIS — E049 Nontoxic goiter, unspecified: Secondary | ICD-10-CM

## 2014-03-27 LAB — PTH, INTACT AND CALCIUM
CALCIUM: 11.1 mg/dL — AB (ref 8.4–10.5)
PTH: 99 pg/mL — AB (ref 14–64)

## 2014-03-27 LAB — VITAMIN D 25 HYDROXY (VIT D DEFICIENCY, FRACTURES): Vit D, 25-Hydroxy: 42 ng/mL (ref 30–89)

## 2014-03-27 NOTE — Telephone Encounter (Signed)
Patient said she Went to Dr Everardo All and he said she needed a Thyroid US and that she needed to contact us about that   She does have [employer] which would have to be pre certed through [place]

## 2014-04-02 ENCOUNTER — Other Ambulatory Visit: Payer: Self-pay | Admitting: Nurse Practitioner

## 2014-04-03 ENCOUNTER — Telehealth: Payer: Self-pay

## 2014-04-03 NOTE — Telephone Encounter (Signed)
[facility] called concerning [place] that was ordered. Pt had requested the [place] be done in [place]. Pt had to request order be placed from her PCP. Order from PCP has been placed and they will proceed with scheduling pt. After test our office will be contacted with results. [facility] wanted MD to be notified.

## 2014-04-05 ENCOUNTER — Other Ambulatory Visit (HOSPITAL_COMMUNITY): Payer: Commercial Managed Care - HMO

## 2014-04-10 ENCOUNTER — Ambulatory Visit (HOSPITAL_COMMUNITY)
Admission: RE | Admit: 2014-04-10 | Discharge: 2014-04-10 | Disposition: A | Discharge status: Home / Self Care | Payer: Medicare HMO | Source: Ambulatory Visit | Attending: Nurse Practitioner | Admitting: Nurse Practitioner

## 2014-04-10 DIAGNOSIS — E049 Nontoxic goiter, unspecified: Secondary | ICD-10-CM | POA: Diagnosis present

## 2014-08-02 ENCOUNTER — Other Ambulatory Visit: Payer: Self-pay | Admitting: Nurse Practitioner

## 2014-10-16 ENCOUNTER — Other Ambulatory Visit: Payer: Self-pay | Admitting: Nurse Practitioner

## 2014-10-29 ENCOUNTER — Other Ambulatory Visit: Payer: Self-pay | Admitting: Nurse Practitioner

## 2014-11-18 ENCOUNTER — Encounter: Payer: Self-pay | Admitting: Nurse Practitioner

## 2014-11-18 ENCOUNTER — Ambulatory Visit (INDEPENDENT_AMBULATORY_CARE_PROVIDER_SITE_OTHER): Payer: Commercial Managed Care - HMO | Admitting: Nurse Practitioner

## 2014-11-18 VITALS — BP 130/78 | HR 49 | Temp 97.1°F | Ht 61.0 in | Wt 180.0 lb

## 2014-11-18 DIAGNOSIS — Z23 Encounter for immunization: Secondary | ICD-10-CM | POA: Diagnosis not present

## 2014-11-18 DIAGNOSIS — E785 Hyperlipidemia, unspecified: Secondary | ICD-10-CM | POA: Diagnosis not present

## 2014-11-18 DIAGNOSIS — I10 Essential (primary) hypertension: Secondary | ICD-10-CM

## 2014-11-18 DIAGNOSIS — E210 Primary hyperparathyroidism: Secondary | ICD-10-CM

## 2014-11-18 MED ORDER — SIMVASTATIN 20 MG PO TABS: 20.0000 mg | ORAL_TABLET | Freq: Every day | ORAL | Status: DC

## 2014-11-18 MED ORDER — RAMIPRIL 5 MG PO CAPS: ORAL_CAPSULE | ORAL | Status: DC

## 2014-11-18 NOTE — Addendum Note (Signed)
Addended by: Cleda DaubUCKER, Mahari Strahm G on: 11/18/2014 01:52 PM   Modules accepted: Orders

## 2014-11-18 NOTE — Patient Instructions (Signed)
Exercise to Stay Healthy Exercise helps you become and stay healthy. EXERCISE IDEAS AND TIPS Choose exercises that:  You enjoy.  Fit into your day. You do not need to exercise really hard to be healthy. You can do exercises at a slow or medium level and stay healthy. You can:  Stretch before and after working out.  Try yoga, Pilates, or tai chi.  Lift weights.  Walk fast, swim, jog, run, climb stairs, bicycle, dance, or rollerskate.  Take aerobic classes. Exercises that burn about 150 calories:  Running 1  miles in 15 minutes.  Playing volleyball for 45 to 60 minutes.  Washing and waxing a car for 45 to 60 minutes.  Playing touch football for 45 minutes.  Walking 1  miles in 35 minutes.  Pushing a stroller 1  miles in 30 minutes.  Playing basketball for 30 minutes.  Raking leaves for 30 minutes.  Bicycling 5 miles in 30 minutes.  Walking 2 miles in 30 minutes.  Dancing for 30 minutes.  Shoveling snow for 15 minutes.  Swimming laps for 20 minutes.  Walking up stairs for 15 minutes.  Bicycling 4 miles in 15 minutes.  Gardening for 30 to 45 minutes.  Jumping rope for 15 minutes.  Washing windows or floors for 45 to 60 minutes. Document Released: 08/14/2010 Document Revised: 10/04/2011 Document Reviewed: 08/14/2010 [employer] Patient Information 2015 [employer]. This information is not intended to replace advice given to you by your health care provider. Make sure you discuss any questions you have with your health care provider.  

## 2014-11-18 NOTE — Progress Notes (Signed)
Subjective:    Patient ID: Mariah Fletcher, female    DOB: Jan 19, 1935, 80 y.o.   MRN: [id number]  Patient here today for follow up of chronic medical problems. SHe has  No complaints today  Hypertension This is a chronic problem. The current episode started more than 1 year ago. The problem is unchanged. The problem is controlled. Pertinent negatives include no chest pain, headaches, palpitations or shortness of breath. Risk factors for coronary artery disease include dyslipidemia, obesity, post-menopausal state and sedentary lifestyle. Past treatments include ACE inhibitors. The current treatment provides moderate improvement. Compliance problems include diet and exercise.   Hyperlipidemia This is a chronic problem. The current episode started more than 1 year ago. The problem is controlled. Recent lipid tests were reviewed and are normal. Exacerbating diseases include obesity. She has no history of diabetes or hypothyroidism. Pertinent negatives include no chest pain, myalgias or shortness of breath. Current antihyperlipidemic treatment includes statins. The current treatment provides moderate improvement of lipids. Compliance problems include adherence to diet and adherence to exercise.  Risk factors for coronary artery disease include dyslipidemia, hypertension, obesity and post-menopausal.  vitamin d deficiency Vitamin D OTC- no side effects hyperparathyroidism On no meds- just watching     Review of Systems  Constitutional: Negative.   HENT: Negative.   Respiratory: Negative for shortness of breath.   Cardiovascular: Negative for chest pain and palpitations.  Genitourinary: Negative.        Denies urinary incontinence  Musculoskeletal: Negative for myalgias.  Neurological: Negative for headaches.  Psychiatric/Behavioral: Negative.   All other systems reviewed and are negative.      Objective:   Physical Exam  Constitutional: She is oriented to person, place, and time. She appears  well-developed and well-nourished.  HENT:  Nose: Nose normal.  Mouth/Throat: Oropharynx is clear and moist.  Eyes: EOM are normal.  Neck: Trachea normal, normal range of motion and full passive range of motion without pain. Neck supple. No JVD present. Carotid bruit is not present. No thyromegaly present.  Cardiovascular: Normal rate, regular rhythm, normal heart sounds and intact distal pulses.  Exam reveals no gallop and no friction rub.   No murmur heard. Pulmonary/Chest: Effort normal and breath sounds normal.  Abdominal: Soft. Bowel sounds are normal. She exhibits no distension and no mass. There is no tenderness.  Musculoskeletal: Normal range of motion.  Lymphadenopathy:    She has no cervical adenopathy.  Neurological: She is alert and oriented to person, place, and time. She has normal reflexes.  Skin: Skin is warm and dry.     Psychiatric: She has a normal mood and affect. Her behavior is normal. Judgment and thought content normal.    BP 130/78 mmHg  Pulse 49  Temp(Src) 97.1 F (36.2 C) (Oral)  Ht $R'5\' 1"'KX$  (1.549 m)  Wt 180 lb (81.647 kg)  BMI 34.03 kg/m2       Assessment & Plan:    1. Essential hypertension, benign Do not add salt to diet - ramipril (ALTACE) 5 MG capsule; TAKE 1 CAPSULE (5 MG TOTAL) BY MOUTH DAILY.  Dispense: 90 capsule; Refill: 0 - CMP14+EGFR  2. Hyperparathyroidism, primary Keep follow up appointment with specialist  3. Hyperlipidemia with target LDL less than 100 Low fat diet - simvastatin (ZOCOR) 20 MG tablet; Take 1 tablet (20 mg total) by mouth at bedtime.  Dispense: 90 tablet; Refill: 0 - NMR, lipoprofile   prevnar 13 today Labs pending Health maintenance reviewed Diet and exercise encouraged Continue all meds  Follow up  In 3 months   [place], FNP

## 2014-11-19 LAB — CMP14+EGFR
A/G RATIO: 1.4 (ref 1.1–2.5)
ALK PHOS: 63 IU/L (ref 39–117)
ALT: 10 IU/L (ref 0–32)
AST: 14 IU/L (ref 0–40)
Albumin: 4.1 g/dL (ref 3.5–4.8)
BUN/Creatinine Ratio: 14 (ref 11–26)
BUN: 9 mg/dL (ref 8–27)
Bilirubin Total: 0.7 mg/dL (ref 0.0–1.2)
CALCIUM: 11.1 mg/dL — AB (ref 8.7–10.3)
CO2: 28 mmol/L (ref 18–29)
CREATININE: 0.64 mg/dL (ref 0.57–1.00)
Chloride: 104 mmol/L (ref 97–108)
GFR calc non Af Amer: 86 mL/min/{1.73_m2} (ref >59)
GFR, EST AFRICAN AMERICAN: 100 mL/min/{1.73_m2} (ref >59)
GLUCOSE: 94 mg/dL (ref 65–99)
Globulin, Total: 3 g/dL (ref 1.5–4.5)
Potassium: 5.2 mmol/L (ref 3.5–5.2)
SODIUM: 142 mmol/L (ref 134–144)
TOTAL PROTEIN: 7.1 g/dL (ref 6.0–8.5)

## 2014-11-19 LAB — NMR, LIPOPROFILE
Cholesterol: 119 mg/dL (ref 100–199)
HDL CHOLESTEROL BY NMR: 54 mg/dL (ref >39)
HDL Particle Number: 26.6 umol/L — ABNORMAL LOW (ref >=30.5)
LDL Particle Number: <300 nmol/L (ref <1000)
LDL-C: 44 mg/dL (ref 0–99)
LP-IR Score: 32 (ref <=45)
Small LDL Particle Number: <90 nmol/L (ref <=527)
Triglycerides by NMR: 106 mg/dL (ref 0–149)

## 2014-12-18 DIAGNOSIS — Z0100 Encounter for examination of eyes and vision without abnormal findings: Secondary | ICD-10-CM | POA: Diagnosis not present

## 2014-12-18 DIAGNOSIS — H5203 Hypermetropia, bilateral: Secondary | ICD-10-CM | POA: Diagnosis not present

## 2014-12-18 DIAGNOSIS — H524 Presbyopia: Secondary | ICD-10-CM | POA: Diagnosis not present

## 2014-12-18 DIAGNOSIS — H2513 Age-related nuclear cataract, bilateral: Secondary | ICD-10-CM | POA: Diagnosis not present

## 2014-12-18 DIAGNOSIS — H43813 Vitreous degeneration, bilateral: Secondary | ICD-10-CM | POA: Diagnosis not present

## 2015-02-11 ENCOUNTER — Other Ambulatory Visit: Payer: Self-pay | Admitting: Nurse Practitioner

## 2015-02-24 ENCOUNTER — Encounter: Payer: Self-pay | Admitting: Nurse Practitioner

## 2015-02-24 ENCOUNTER — Ambulatory Visit (INDEPENDENT_AMBULATORY_CARE_PROVIDER_SITE_OTHER): Payer: Commercial Managed Care - HMO

## 2015-02-24 ENCOUNTER — Ambulatory Visit (INDEPENDENT_AMBULATORY_CARE_PROVIDER_SITE_OTHER): Payer: Commercial Managed Care - HMO | Admitting: Nurse Practitioner

## 2015-02-24 ENCOUNTER — Encounter (INDEPENDENT_AMBULATORY_CARE_PROVIDER_SITE_OTHER): Payer: Self-pay

## 2015-02-24 VITALS — BP 132/88 | HR 51 | Temp 98.0°F | Ht 61.0 in | Wt 176.4 lb

## 2015-02-24 DIAGNOSIS — E785 Hyperlipidemia, unspecified: Secondary | ICD-10-CM

## 2015-02-24 DIAGNOSIS — I10 Essential (primary) hypertension: Secondary | ICD-10-CM

## 2015-02-24 DIAGNOSIS — Z6833 Body mass index (BMI) 33.0-33.9, adult: Secondary | ICD-10-CM | POA: Insufficient documentation

## 2015-02-24 DIAGNOSIS — E210 Primary hyperparathyroidism: Secondary | ICD-10-CM | POA: Diagnosis not present

## 2015-02-24 DIAGNOSIS — M8580 Other specified disorders of bone density and structure, unspecified site: Secondary | ICD-10-CM | POA: Diagnosis not present

## 2015-02-24 MED ORDER — RAMIPRIL 5 MG PO CAPS: ORAL_CAPSULE | ORAL | Status: DC

## 2015-02-24 MED ORDER — SIMVASTATIN 20 MG PO TABS: ORAL_TABLET | ORAL | Status: DC

## 2015-02-24 NOTE — Progress Notes (Signed)
Subjective:    Patient ID: Mariah Fletcher, female    DOB: 1935/03/15, 80 y.o.   MRN: [id number]  Patient here today for follow up of chronic medical problems. SHe has  No complaints today  Hypertension This is a chronic problem. The current episode started more than 1 year ago. The problem is unchanged. The problem is controlled. Pertinent negatives include no chest pain, headaches, palpitations or shortness of breath. Risk factors for coronary artery disease include dyslipidemia, obesity, post-menopausal state and sedentary lifestyle. Past treatments include ACE inhibitors. The current treatment provides moderate improvement. Compliance problems include diet and exercise.   Hyperlipidemia This is a chronic problem. The current episode started more than 1 year ago. The problem is controlled. Recent lipid tests were reviewed and are normal. Exacerbating diseases include obesity. She has no history of diabetes or hypothyroidism. Pertinent negatives include no chest pain, myalgias or shortness of breath. Current antihyperlipidemic treatment includes statins. The current treatment provides moderate improvement of lipids. Compliance problems include adherence to diet and adherence to exercise.  Risk factors for coronary artery disease include dyslipidemia, hypertension, obesity and post-menopausal.  vitamin d deficiency Vitamin D OTC- no side effects hyperparathyroidism On no meds- just watching- calcium has been slightly elevated in past     Review of Systems  Constitutional: Negative.   HENT: Negative.   Respiratory: Negative for shortness of breath.   Cardiovascular: Negative for chest pain and palpitations.  Genitourinary: Negative.        Denies urinary incontinence  Musculoskeletal: Negative for myalgias.  Neurological: Negative for headaches.  Psychiatric/Behavioral: Negative.   All other systems reviewed and are negative.      Objective:   Physical Exam  Constitutional: She is  oriented to person, place, and time. She appears well-developed and well-nourished.  HENT:  Nose: Nose normal.  Mouth/Throat: Oropharynx is clear and moist.  Eyes: EOM are normal.  Neck: Trachea normal, normal range of motion and full passive range of motion without pain. Neck supple. No JVD present. Carotid bruit is not present. No thyromegaly present.  Cardiovascular: Normal rate, regular rhythm, normal heart sounds and intact distal pulses.  Exam reveals no gallop and no friction rub.   No murmur heard. Pulmonary/Chest: Effort normal and breath sounds normal.  Abdominal: Soft. Bowel sounds are normal. She exhibits no distension and no mass. There is no tenderness.  Musculoskeletal: Normal range of motion.  Lymphadenopathy:    She has no cervical adenopathy.  Neurological: She is alert and oriented to person, place, and time. She has normal reflexes.  Skin: Skin is warm and dry.     Psychiatric: She has a normal mood and affect. Her behavior is normal. Judgment and thought content normal.   BP 132/88 mmHg  Pulse 51  Temp(Src) 98 F (36.7 C) (Oral)  Ht _0  (1.549 m)  Wt 176 lb 6.4 oz (80.015 kg)  BMI 33.35 kg/m2  EKG- sinus bradycardia-Mary-Margaret Hassell Done, FNP   Chest x ray- no cardiopulmoary problems  Noted-Preliminary reading by Ronnald Collum, FNP  [facility]        Assessment & Plan:    1. Essential hypertension, benign Do not add salt to diet - DG Chest 2 View; Future - EKG 12-Lead - CMP14+EGFR - ramipril (ALTACE) 5 MG capsule; TAKE 1 CAPSULE (5 MG TOTAL) BY MOUTH DAILY.  Dispense: 90 capsule; Refill: 1  2. Hyperparathyroidism, primary  3. Osteopenia Weight bearing exercises encouraged  4. Hyperlipidemia with target LDL less than 100 Low fat diet -  Lipid panel - simvastatin (ZOCOR) 20 MG tablet; TAKE 1 TABLET (20 MG TOTAL) BY MOUTH AT BEDTIME.  Dispense: 90 tablet; Refill: 1  5. Hypercalcemia Decrease calcium consumption in diet  6. BMI  33.0-33.9,adult Discussed diet and exercise for person with BMI >25 Will recheck weight in 3-6 months     Labs pending Health maintenance reviewed Diet and exercise encouraged Continue all meds Follow up  In 3 months   [place], FNP

## 2015-02-24 NOTE — Patient Instructions (Signed)

## 2015-02-25 ENCOUNTER — Other Ambulatory Visit: Payer: Self-pay | Admitting: *Deleted

## 2015-02-25 DIAGNOSIS — E875 Hyperkalemia: Secondary | ICD-10-CM

## 2015-02-25 LAB — LIPID PANEL
Chol/HDL Ratio: 2 ratio units (ref 0.0–4.4)
Cholesterol, Total: 122 mg/dL (ref 100–199)
HDL: 61 mg/dL (ref >39)
LDL Calculated: 45 mg/dL (ref 0–99)
TRIGLYCERIDES: 78 mg/dL (ref 0–149)
VLDL Cholesterol Cal: 16 mg/dL (ref 5–40)

## 2015-02-25 LAB — CMP14+EGFR
ALK PHOS: 70 IU/L (ref 39–117)
ALT: 12 IU/L (ref 0–32)
AST: 12 IU/L (ref 0–40)
Albumin/Globulin Ratio: 1.2 (ref 1.1–2.5)
Albumin: 4.2 g/dL (ref 3.5–4.8)
BILIRUBIN TOTAL: 1.2 mg/dL (ref 0.0–1.2)
BUN / CREAT RATIO: 17 (ref 11–26)
BUN: 12 mg/dL (ref 8–27)
CO2: 25 mmol/L (ref 18–29)
Calcium: 11.5 mg/dL — ABNORMAL HIGH (ref 8.7–10.3)
Chloride: 103 mmol/L (ref 97–108)
Creatinine, Ser: 0.69 mg/dL (ref 0.57–1.00)
GFR calc Af Amer: 96 mL/min/{1.73_m2} (ref >59)
GFR calc non Af Amer: 84 mL/min/{1.73_m2} (ref >59)
Globulin, Total: 3.6 g/dL (ref 1.5–4.5)
Glucose: 92 mg/dL (ref 65–99)
Potassium: 5.6 mmol/L — ABNORMAL HIGH (ref 3.5–5.2)
SODIUM: 142 mmol/L (ref 134–144)
Total Protein: 7.8 g/dL (ref 6.0–8.5)

## 2015-03-04 ENCOUNTER — Other Ambulatory Visit (INDEPENDENT_AMBULATORY_CARE_PROVIDER_SITE_OTHER): Payer: Commercial Managed Care - HMO

## 2015-03-04 DIAGNOSIS — E875 Hyperkalemia: Secondary | ICD-10-CM

## 2015-03-04 NOTE — Progress Notes (Signed)
Lab only 

## 2015-03-05 LAB — CMP14+EGFR
A/G RATIO: 1.2 (ref 1.1–2.5)
ALT: 13 IU/L (ref 0–32)
AST: 13 IU/L (ref 0–40)
Albumin: 4.2 g/dL (ref 3.5–4.8)
Alkaline Phosphatase: 63 IU/L (ref 39–117)
BUN/Creatinine Ratio: 15 (ref 11–26)
BUN: 10 mg/dL (ref 8–27)
Bilirubin Total: 0.7 mg/dL (ref 0.0–1.2)
CALCIUM: 11.5 mg/dL — AB (ref 8.7–10.3)
CHLORIDE: 101 mmol/L (ref 97–108)
CO2: 26 mmol/L (ref 18–29)
Creatinine, Ser: 0.67 mg/dL (ref 0.57–1.00)
GFR calc Af Amer: 97 mL/min/{1.73_m2} (ref >59)
GFR, EST NON AFRICAN AMERICAN: 84 mL/min/{1.73_m2} (ref >59)
Globulin, Total: 3.5 g/dL (ref 1.5–4.5)
Glucose: 98 mg/dL (ref 65–99)
Potassium: 4.7 mmol/L (ref 3.5–5.2)
SODIUM: 142 mmol/L (ref 134–144)
Total Protein: 7.7 g/dL (ref 6.0–8.5)

## 2015-04-14 DIAGNOSIS — Z1231 Encounter for screening mammogram for malignant neoplasm of breast: Secondary | ICD-10-CM | POA: Diagnosis not present

## 2015-05-01 ENCOUNTER — Encounter: Payer: Self-pay | Admitting: Family Medicine

## 2015-08-21 ENCOUNTER — Encounter: Payer: Self-pay | Admitting: Nurse Practitioner

## 2015-08-21 ENCOUNTER — Ambulatory Visit (INDEPENDENT_AMBULATORY_CARE_PROVIDER_SITE_OTHER): Payer: Commercial Managed Care - HMO | Admitting: Nurse Practitioner

## 2015-08-21 VITALS — BP 135/72 | HR 47 | Temp 97.1°F | Ht 61.0 in | Wt 187.0 lb

## 2015-08-21 DIAGNOSIS — E210 Primary hyperparathyroidism: Secondary | ICD-10-CM | POA: Diagnosis not present

## 2015-08-21 DIAGNOSIS — Z6833 Body mass index (BMI) 33.0-33.9, adult: Secondary | ICD-10-CM | POA: Diagnosis not present

## 2015-08-21 DIAGNOSIS — I10 Essential (primary) hypertension: Secondary | ICD-10-CM

## 2015-08-21 DIAGNOSIS — M8580 Other specified disorders of bone density and structure, unspecified site: Secondary | ICD-10-CM

## 2015-08-21 DIAGNOSIS — E785 Hyperlipidemia, unspecified: Secondary | ICD-10-CM | POA: Diagnosis not present

## 2015-08-21 MED ORDER — RAMIPRIL 5 MG PO CAPS: ORAL_CAPSULE | ORAL | Status: DC

## 2015-08-21 MED ORDER — SIMVASTATIN 20 MG PO TABS: ORAL_TABLET | ORAL | Status: DC

## 2015-08-21 NOTE — Progress Notes (Signed)
Subjective:    Patient ID: Mariah Fletcher, female    DOB: 08/06/1934, 81 y.o.   MRN: [id number]  Patient here today for follow up of chronic medical problems.  Outpatient Encounter Prescriptions as of 08/21/2015  Medication Sig  . aspirin 81 MG tablet Take 81 mg by mouth daily.  . cholecalciferol (VITAMIN D) 1000 UNITS tablet Take 1 tablet (1,000 Units total) by mouth daily.  . ramipril (ALTACE) 5 MG capsule TAKE 1 CAPSULE (5 MG TOTAL) BY MOUTH DAILY.  . simvastatin (ZOCOR) 20 MG tablet TAKE 1 TABLET (20 MG TOTAL) BY MOUTH AT BEDTIME.   No facility-administered encounter medications on file as of 08/21/2015.     Hypertension This is a chronic problem. The current episode started more than 1 year ago. The problem is unchanged. The problem is controlled. Pertinent negatives include no chest pain, headaches, palpitations or shortness of breath. Risk factors for coronary artery disease include dyslipidemia, obesity, post-menopausal state and sedentary lifestyle. Past treatments include ACE inhibitors. The current treatment provides moderate improvement. Compliance problems include diet and exercise.   Hyperlipidemia This is a chronic problem. The current episode started more than 1 year ago. The problem is controlled. Recent lipid tests were reviewed and are normal. Exacerbating diseases include obesity. She has no history of diabetes or hypothyroidism. Pertinent negatives include no chest pain, myalgias or shortness of breath. Current antihyperlipidemic treatment includes statins. The current treatment provides moderate improvement of lipids. Compliance problems include adherence to diet and adherence to exercise.  Risk factors for coronary artery disease include dyslipidemia, hypertension, obesity and post-menopausal.  vitamin d deficiency Vitamin D OTC- no side effects hyperparathyroidism On no meds- just watching- calcium has been slightly elevated in past     Review of Systems   Constitutional: Negative.   HENT: Negative.   Respiratory: Negative for shortness of breath.   Cardiovascular: Negative for chest pain and palpitations.  Genitourinary: Negative.        Denies urinary incontinence  Musculoskeletal: Negative for myalgias.  Neurological: Negative for headaches.  Psychiatric/Behavioral: Negative.   All other systems reviewed and are negative.      Objective:   Physical Exam  Constitutional: She is oriented to person, place, and time. She appears well-developed and well-nourished.  HENT:  Nose: Nose normal.  Mouth/Throat: Oropharynx is clear and moist.  Eyes: EOM are normal.  Neck: Trachea normal, normal range of motion and full passive range of motion without pain. Neck supple. No JVD present. Carotid bruit is not present. No thyromegaly present.  Cardiovascular: Normal rate, regular rhythm, normal heart sounds and intact distal pulses.  Exam reveals no gallop and no friction rub.   No murmur heard. Pulmonary/Chest: Effort normal and breath sounds normal.  Abdominal: Soft. Bowel sounds are normal. She exhibits no distension and no mass. There is no tenderness.  Musculoskeletal: Normal range of motion.  Lymphadenopathy:    She has no cervical adenopathy.  Neurological: She is alert and oriented to person, place, and time. She has normal reflexes.  Skin: Skin is warm and dry.     Psychiatric: She has a normal mood and affect. Her behavior is normal. Judgment and thought content normal.     BP 135/72 mmHg  Pulse 47  Temp(Src) 97.1 F (36.2 C) (Oral)  Ht _0  (1.549 m)  Wt 187 lb (84.823 kg)  BMI 35.35 kg/m2       Assessment & Plan:   1. Essential hypertension, benign Do not add salt o diet -  ramipril (ALTACE) 5 MG capsule; TAKE 1 CAPSULE (5 MG TOTAL) BY MOUTH DAILY.  Dispense: 90 capsule; Refill: 1 - CMP14+EGFR  2. Hyperparathyroidism, primary ([place]) Keep follow up with specialist  3. Osteopenia Weight bearing exercises  4.  Hyperlipidemia with target LDL less than 100 Low fat diet - simvastatin (ZOCOR) 20 MG tablet; TAKE 1 TABLET (20 MG TOTAL) BY MOUTH AT BEDTIME.  Dispense: 90 tablet; Refill: 1 - Lipid panel  5. BMI 33.0-33.9,adult Discussed diet and exercise for person with BMI >25 Will recheck weight in 3-6 months     Labs pending Health maintenance reviewed Diet and exercise encouraged Continue all meds Follow up  In 6 months   [place], FNP

## 2015-08-21 NOTE — Patient Instructions (Signed)
Bone Health Bones protect organs, store calcium, and anchor muscles. Good health habits, such as eating nutritious foods and exercising regularly, are important for maintaining healthy bones. They can also help to prevent a condition that causes bones to lose density and become weak and brittle (osteoporosis). WHY IS BONE MASS IMPORTANT? Bone mass refers to the amount of bone tissue that you have. The higher your bone mass, the stronger your bones. An important step toward having healthy bones throughout life is to have strong and dense bones during childhood. A young adult who has a high bone mass is more likely to have a high bone mass later in life. Bone mass at its greatest it is called peak bone mass. A large decline in bone mass occurs in older adults. In women, it occurs about the time of menopause. During this time, it is important to practice good health habits, because if more bone is lost than what is replaced, the bones will become less healthy and more likely to break (fracture). If you find that you have a low bone mass, you may be able to prevent osteoporosis or further bone loss by changing your diet and lifestyle. HOW CAN I FIND OUT IF MY BONE MASS IS LOW? Bone mass can be measured with an X-ray test that is called a bone mineral density (BMD) test. This test is recommended for all women who are age 65 or older. It may also be recommended for men who are age 70 or older, or for people who are more likely to develop osteoporosis due to:  Having bones that break easily.  Having a long-term disease that weakens bones, such as kidney disease or rheumatoid arthritis.  Having menopause earlier than normal.  Taking medicine that weakens bones, such as steroids, thyroid hormones, or hormone treatment for breast cancer or prostate cancer.  Smoking.  Drinking three or more alcoholic drinks each day. WHAT ARE THE NUTRITIONAL RECOMMENDATIONS FOR HEALTHY BONES? To have healthy bones, you need  to get enough of the right minerals and vitamins. Most nutrition experts recommend getting these nutrients from the foods that you eat. Nutritional recommendations vary from person to person. Ask your health care provider what is healthy for you. Here are some general guidelines. Calcium Recommendations Calcium is the most important (essential) mineral for bone health. Most people can get enough calcium from their diet, but supplements may be recommended for people who are at risk for osteoporosis. Good sources of calcium include:  Dairy products, such as low-fat or nonfat milk, cheese, and yogurt.  Dark green leafy vegetables, such as bok choy and broccoli.  Calcium-fortified foods, such as orange juice, cereal, bread, soy beverages, and tofu products.  Nuts, such as almonds. Follow these recommended amounts for daily calcium intake:  Children, age 1-3: 700 mg.  Children, age 4-8: 1,000 mg.  Children, age 9-13: 1,300 mg.  Teens, age 14-18: 1,300 mg.  Adults, age 19-50: 1,000 mg.  Adults, age 51-70:  Men: 1,000 mg.  Women: 1,200 mg.  Adults, age 71 or older: 1,200 mg.  Pregnant and breastfeeding females:  Teens: 1,300 mg.  Adults: 1,000 mg. Vitamin D Recommendations Vitamin D is the most essential vitamin for bone health. It helps the body to absorb calcium. Sunlight stimulates the skin to make vitamin D, so be sure to get enough sunlight. If you live in a cold climate or you do not get outside often, your health care provider may recommend that you take vitamin D supplements. Good   sources of vitamin D in your diet include:  Egg yolks.  Saltwater fish.  Milk and cereal fortified with vitamin D. Follow these recommended amounts for daily vitamin D intake:  Children and teens, age 1-18: 600 international units.  Adults, age 50 or younger: 400-800 international units.  Adults, age 51 or older: 800-1,000 international units. Other Nutrients Other nutrients for bone  health include:  Phosphorus. This mineral is found in meat, poultry, dairy foods, nuts, and legumes. The recommended daily intake for adult men and adult women is 700 mg.  Magnesium. This mineral is found in seeds, nuts, dark green vegetables, and legumes. The recommended daily intake for adult men is 400-420 mg. For adult women, it is 310-320 mg.  Vitamin K. This vitamin is found in green leafy vegetables. The recommended daily intake is 120 mg for adult men and 90 mg for adult women. WHAT TYPE OF PHYSICAL ACTIVITY IS BEST FOR BUILDING AND MAINTAINING HEALTHY BONES? Weight-bearing and strength-building activities are important for building and maintaining peak bone mass. Weight-bearing activities cause muscles and bones to work against gravity. Strength-building activities increases muscle strength that supports bones. Weight-bearing and muscle-building activities include:  Walking and hiking.  Jogging and running.  Dancing.  Gym exercises.  Lifting weights.  Tennis and racquetball.  Climbing stairs.  Aerobics. Adults should get at least 30 minutes of moderate physical activity on most days. Children should get at least 60 minutes of moderate physical activity on most days. Ask your health care provide what type of exercise is best for you. WHERE CAN I FIND MORE INFORMATION? For more information, check out the following websites:  National Osteoporosis Foundation: http://nof.org/learn/basics  National Institutes of Health: http://www.niams.nih.gov/Health_Info/Bone/Bone_Health/bone_health_for_life.asp   This information is not intended to replace advice given to you by your health care provider. Make sure you discuss any questions you have with your health care provider.   Document Released: 10/02/2003 Document Revised: 11/26/2014 Document Reviewed: 07/17/2014 Elsevier Interactive Patient Education 2016 Elsevier Inc.  

## 2015-08-22 LAB — CMP14+EGFR
ALT: 12 IU/L (ref 0–32)
AST: 15 IU/L (ref 0–40)
Albumin/Globulin Ratio: 1.4 (ref 1.1–2.5)
Albumin: 4.2 g/dL (ref 3.5–4.8)
Alkaline Phosphatase: 61 IU/L (ref 39–117)
BUN/Creatinine Ratio: 15 (ref 11–26)
BUN: 10 mg/dL (ref 8–27)
Bilirubin Total: 0.7 mg/dL (ref 0.0–1.2)
CO2: 25 mmol/L (ref 18–29)
Calcium: 10.9 mg/dL — ABNORMAL HIGH (ref 8.7–10.3)
Chloride: 105 mmol/L (ref 96–106)
Creatinine, Ser: 0.67 mg/dL (ref 0.57–1.00)
GFR calc Af Amer: 97 mL/min/{1.73_m2} (ref >59)
GFR, EST NON AFRICAN AMERICAN: 84 mL/min/{1.73_m2} (ref >59)
GLOBULIN, TOTAL: 3 g/dL (ref 1.5–4.5)
Glucose: 87 mg/dL (ref 65–99)
Potassium: 5.2 mmol/L (ref 3.5–5.2)
Sodium: 143 mmol/L (ref 134–144)
Total Protein: 7.2 g/dL (ref 6.0–8.5)

## 2015-08-22 LAB — LIPID PANEL
CHOL/HDL RATIO: 2.4 ratio (ref 0.0–4.4)
Cholesterol, Total: 127 mg/dL (ref 100–199)
HDL: 52 mg/dL (ref >39)
LDL Calculated: 51 mg/dL (ref 0–99)
TRIGLYCERIDES: 118 mg/dL (ref 0–149)
VLDL CHOLESTEROL CAL: 24 mg/dL (ref 5–40)

## 2015-08-22 NOTE — Addendum Note (Signed)
Addended by: Tamera Punt on: 08/22/2015 04:03 PM   Modules accepted: Orders

## 2015-08-27 ENCOUNTER — Other Ambulatory Visit: Payer: Commercial Managed Care - HMO

## 2015-08-29 LAB — PTH, INTACT AND CALCIUM: PTH: 64 pg/mL (ref 15–65)

## 2015-12-29 DIAGNOSIS — H2513 Age-related nuclear cataract, bilateral: Secondary | ICD-10-CM | POA: Diagnosis not present

## 2015-12-29 DIAGNOSIS — H43813 Vitreous degeneration, bilateral: Secondary | ICD-10-CM | POA: Diagnosis not present

## 2015-12-29 DIAGNOSIS — H524 Presbyopia: Secondary | ICD-10-CM | POA: Diagnosis not present

## 2015-12-29 DIAGNOSIS — Z0100 Encounter for examination of eyes and vision without abnormal findings: Secondary | ICD-10-CM | POA: Diagnosis not present

## 2015-12-29 DIAGNOSIS — H5203 Hypermetropia, bilateral: Secondary | ICD-10-CM | POA: Diagnosis not present

## 2016-02-16 DIAGNOSIS — Z79899 Other long term (current) drug therapy: Secondary | ICD-10-CM | POA: Diagnosis not present

## 2016-02-16 DIAGNOSIS — M47812 Spondylosis without myelopathy or radiculopathy, cervical region: Secondary | ICD-10-CM | POA: Diagnosis not present

## 2016-02-16 DIAGNOSIS — M542 Cervicalgia: Secondary | ICD-10-CM | POA: Diagnosis not present

## 2016-02-16 DIAGNOSIS — R509 Fever, unspecified: Secondary | ICD-10-CM | POA: Diagnosis not present

## 2016-02-16 DIAGNOSIS — M47892 Other spondylosis, cervical region: Secondary | ICD-10-CM | POA: Diagnosis not present

## 2016-02-16 DIAGNOSIS — Z7982 Long term (current) use of aspirin: Secondary | ICD-10-CM | POA: Diagnosis not present

## 2016-02-16 DIAGNOSIS — E7800 Pure hypercholesterolemia, unspecified: Secondary | ICD-10-CM | POA: Diagnosis not present

## 2016-02-16 DIAGNOSIS — M19011 Primary osteoarthritis, right shoulder: Secondary | ICD-10-CM | POA: Diagnosis not present

## 2016-02-16 DIAGNOSIS — I10 Essential (primary) hypertension: Secondary | ICD-10-CM | POA: Diagnosis not present

## 2016-02-19 ENCOUNTER — Other Ambulatory Visit: Payer: Self-pay | Admitting: Nurse Practitioner

## 2016-02-19 DIAGNOSIS — E785 Hyperlipidemia, unspecified: Secondary | ICD-10-CM

## 2016-02-20 ENCOUNTER — Encounter: Payer: Self-pay | Admitting: Nurse Practitioner

## 2016-02-20 ENCOUNTER — Ambulatory Visit (INDEPENDENT_AMBULATORY_CARE_PROVIDER_SITE_OTHER): Payer: Commercial Managed Care - HMO | Admitting: Nurse Practitioner

## 2016-02-20 VITALS — BP 138/80 | HR 60 | Temp 97.8°F | Ht 61.0 in | Wt 187.0 lb

## 2016-02-20 DIAGNOSIS — E210 Primary hyperparathyroidism: Secondary | ICD-10-CM

## 2016-02-20 DIAGNOSIS — E559 Vitamin D deficiency, unspecified: Secondary | ICD-10-CM | POA: Diagnosis not present

## 2016-02-20 DIAGNOSIS — Z6835 Body mass index (BMI) 35.0-35.9, adult: Secondary | ICD-10-CM

## 2016-02-20 DIAGNOSIS — E785 Hyperlipidemia, unspecified: Secondary | ICD-10-CM

## 2016-02-20 DIAGNOSIS — I10 Essential (primary) hypertension: Secondary | ICD-10-CM | POA: Diagnosis not present

## 2016-02-20 MED ORDER — SIMVASTATIN 20 MG PO TABS: 20.0000 mg | ORAL_TABLET | Freq: Every day | ORAL | 1 refills | Status: DC

## 2016-02-20 MED ORDER — RAMIPRIL 5 MG PO CAPS: ORAL_CAPSULE | ORAL | 1 refills | Status: DC

## 2016-02-20 NOTE — Progress Notes (Signed)
Subjective:    Patient ID: Mariah Fletcher, female    DOB: 10/08/1934, 81 y.o.   MRN: [id number]  Patient here today for follow up of chronic medical problems. No complaints today.  Outpatient Encounter Prescriptions as of 02/20/2016  Medication Sig  . aspirin 81 MG tablet Take 81 mg by mouth daily.  . cholecalciferol (VITAMIN D) 1000 UNITS tablet Take 1 tablet (1,000 Units total) by mouth daily.  . ramipril (ALTACE) 5 MG capsule TAKE 1 CAPSULE (5 MG TOTAL) BY MOUTH DAILY.  . simvastatin (ZOCOR) 20 MG tablet TAKE 1 TABLET (20 MG TOTAL) BY MOUTH AT BEDTIME.   No facility-administered encounter medications on file as of 02/20/2016.      Hypertension  This is a chronic problem. The current episode started more than 1 year ago. The problem is unchanged. The problem is controlled. Pertinent negatives include no chest pain, headaches, palpitations or shortness of breath. Risk factors for coronary artery disease include dyslipidemia, obesity, post-menopausal state and sedentary lifestyle. Past treatments include ACE inhibitors. The current treatment provides moderate improvement. Compliance problems include diet and exercise.   Hyperlipidemia  This is a chronic problem. The current episode started more than 1 year ago. The problem is controlled. Recent lipid tests were reviewed and are normal. Exacerbating diseases include obesity. She has no history of diabetes or hypothyroidism. Pertinent negatives include no chest pain, myalgias or shortness of breath. Current antihyperlipidemic treatment includes statins. The current treatment provides moderate improvement of lipids. Compliance problems include adherence to diet and adherence to exercise.  Risk factors for coronary artery disease include dyslipidemia, hypertension, obesity and post-menopausal.  vitamin d deficiency Vitamin D OTC- no side effects hyperparathyroidism On no meds- just watching- calcium has been slightly elevated in past     Review  of Systems  Constitutional: Negative.   HENT: Negative.   Respiratory: Negative for shortness of breath.   Cardiovascular: Negative for chest pain and palpitations.  Genitourinary: Negative.        Denies urinary incontinence  Musculoskeletal: Negative for myalgias.  Neurological: Negative for headaches.  Psychiatric/Behavioral: Negative.   All other systems reviewed and are negative.      Objective:   Physical Exam  Constitutional: She is oriented to person, place, and time. She appears well-developed and well-nourished.  HENT:  Nose: Nose normal.  Mouth/Throat: Oropharynx is clear and moist.  Eyes: EOM are normal.  Neck: Trachea normal, normal range of motion and full passive range of motion without pain. Neck supple. No JVD present. Carotid bruit is not present. No thyromegaly present.  Cardiovascular: Normal rate, regular rhythm, normal heart sounds and intact distal pulses.  Exam reveals no gallop and no friction rub.   No murmur heard. Pulmonary/Chest: Effort normal and breath sounds normal.  Abdominal: Soft. Bowel sounds are normal. She exhibits no distension and no mass. There is no tenderness.  Musculoskeletal: Normal range of motion.  Lymphadenopathy:    She has no cervical adenopathy.  Neurological: She is alert and oriented to person, place, and time. She has normal reflexes.  Skin: Skin is warm and dry.     Psychiatric: She has a normal mood and affect. Her behavior is normal. Judgment and thought content normal.     BP 138/80   Pulse 60   Temp 97.8 F (36.6 C) (Oral)   Ht '5\' 1"'  (1.549 m)   Wt 187 lb (84.8 kg)   BMI 35.33 kg/m        Assessment & Plan:  1. Essential hypertension, benign Do not add salt to diet - ramipril (ALTACE) 5 MG capsule; TAKE 1 CAPSULE (5 MG TOTAL) BY MOUTH DAILY.  Dispense: 90 capsule; Refill: 1 - CMP14+EGFR  2. Hyperparathyroidism, primary ([place]) Will continue to watch labs  3. Hyperlipidemia with target LDL less than  100 Low fta diet encouraged - simvastatin (ZOCOR) 20 MG tablet; Take 1 tablet (20 mg total) by mouth daily at 6 PM.  Dispense: 90 tablet; Refill: 1 - Lipid panel  4. BMI 35.0-35.9,adult Discussed diet and exercise for person with BMI >25 Will recheck weight in 3-6 months  5. Vitamin D deficiency Continue vitamin D OTC    Labs pending Health maintenance reviewed Diet and exercise encouraged Continue all meds Follow up  In 6 months   [place], FNP

## 2016-02-20 NOTE — Patient Instructions (Signed)
Fall Prevention in the Home  Falls can cause injuries and can affect people from all age groups. There are many simple things that you can do to make your home safe and to help prevent falls. WHAT CAN I DO ON THE OUTSIDE OF MY HOME?  Regularly repair the edges of walkways and driveways and fix any cracks.  Remove high doorway thresholds.  Trim any shrubbery on the main path into your home.  Use bright outdoor lighting.  Clear walkways of debris and clutter, including tools and rocks.  Regularly check that handrails are securely fastened and in good repair. Both sides of any steps should have handrails.  Install guardrails along the edges of any raised decks or porches.  Have leaves, snow, and ice cleared regularly.  Use sand or salt on walkways during winter months.  In the garage, clean up any spills right away, including grease or oil spills. WHAT CAN I DO IN THE BATHROOM?  Use night lights.  Install grab bars by the toilet and in the tub and shower. Do not use towel bars as grab bars.  Use non-skid mats or decals on the floor of the tub or shower.  If you need to sit down while you are in the shower, use a plastic, non-slip stool..  Keep the floor dry. Immediately clean up any water that spills on the floor.  Remove soap buildup in the tub or shower on a regular basis.  Attach bath mats securely with double-sided non-slip rug tape.  Remove throw rugs and other tripping hazards from the floor. WHAT CAN I DO IN THE BEDROOM?  Use night lights.  Make sure that a bedside light is easy to reach.  Do not use oversized bedding that drapes onto the floor.  Have a firm chair that has side arms to use for getting dressed.  Remove throw rugs and other tripping hazards from the floor. WHAT CAN I DO IN THE KITCHEN?   Clean up any spills right away.  Avoid walking on wet floors.  Place frequently used items in easy-to-reach places.  If you need to reach for something  above you, use a sturdy step stool that has a grab bar.  Keep electrical cables out of the way.  Do not use floor polish or wax that makes floors slippery. If you have to use wax, make sure that it is non-skid floor wax.  Remove throw rugs and other tripping hazards from the floor. WHAT CAN I DO IN THE STAIRWAYS?  Do not leave any items on the stairs.  Make sure that there are handrails on both sides of the stairs. Fix handrails that are broken or loose. Make sure that handrails are as long as the stairways.  Check any carpeting to make sure that it is firmly attached to the stairs. Fix any carpet that is loose or worn.  Avoid having throw rugs at the top or bottom of stairways, or secure the rugs with carpet tape to prevent them from moving.  Make sure that you have a light switch at the top of the stairs and the bottom of the stairs. If you do not have them, have them installed. WHAT ARE SOME OTHER FALL PREVENTION TIPS?  Wear closed-toe shoes that fit well and support your feet. Wear shoes that have rubber soles or low heels.  When you use a stepladder, make sure that it is completely opened and that the sides are firmly locked. Have someone hold the ladder while you   are using it. Do not climb a closed stepladder.  Add color or contrast paint or tape to grab bars and handrails in your home. Place contrasting color strips on the first and last steps.  Use mobility aids as needed, such as canes, walkers, scooters, and crutches.  Turn on lights if it is dark. Replace any light bulbs that burn out.  Set up furniture so that there are clear paths. Keep the furniture in the same spot.  Fix any uneven floor surfaces.  Choose a carpet design that does not hide the edge of steps of a stairway.  Be aware of any and all pets.  Review your medicines with your healthcare provider. Some medicines can cause dizziness or changes in blood pressure, which increase your risk of falling. Talk  with your health care provider about other ways that you can decrease your risk of falls. This may include working with a physical therapist or trainer to improve your strength, balance, and endurance.   This information is not intended to replace advice given to you by your health care provider. Make sure you discuss any questions you have with your health care provider.   Document Released: 07/02/2002 Document Revised: 11/26/2014 Document Reviewed: 08/16/2014 Elsevier Interactive Patient Education 2016 Elsevier Inc.  

## 2016-02-20 NOTE — Addendum Note (Signed)
Addended by: Bennie Pierini on: 02/20/2016 09:48 AM   Modules accepted: Orders

## 2016-02-21 LAB — CMP14+EGFR
A/G RATIO: 1.1 — AB (ref 1.2–2.2)
ALK PHOS: 68 IU/L (ref 39–117)
ALT: 11 IU/L (ref 0–32)
AST: 12 IU/L (ref 0–40)
Albumin: 4 g/dL (ref 3.5–4.8)
BILIRUBIN TOTAL: 0.8 mg/dL (ref 0.0–1.2)
BUN/Creatinine Ratio: 12 (ref 12–28)
BUN: 7 mg/dL — ABNORMAL LOW (ref 8–27)
CHLORIDE: 103 mmol/L (ref 96–106)
CO2: 26 mmol/L (ref 18–29)
Calcium: 10.8 mg/dL — ABNORMAL HIGH (ref 8.7–10.3)
Creatinine, Ser: 0.58 mg/dL (ref 0.57–1.00)
GFR calc non Af Amer: 88 mL/min/{1.73_m2} (ref >59)
GFR, EST AFRICAN AMERICAN: 101 mL/min/{1.73_m2} (ref >59)
Globulin, Total: 3.5 g/dL (ref 1.5–4.5)
Glucose: 88 mg/dL (ref 65–99)
POTASSIUM: 4.7 mmol/L (ref 3.5–5.2)
Sodium: 142 mmol/L (ref 134–144)
TOTAL PROTEIN: 7.5 g/dL (ref 6.0–8.5)

## 2016-02-21 LAB — LIPID PANEL
CHOLESTEROL TOTAL: 100 mg/dL (ref 100–199)
Chol/HDL Ratio: 1.8 ratio units (ref 0.0–4.4)
HDL: 55 mg/dL (ref >39)
LDL Calculated: 33 mg/dL (ref 0–99)
Triglycerides: 61 mg/dL (ref 0–149)
VLDL Cholesterol Cal: 12 mg/dL (ref 5–40)

## 2016-02-21 LAB — VITAMIN D 25 HYDROXY (VIT D DEFICIENCY, FRACTURES): VIT D 25 HYDROXY: 34.9 ng/mL (ref 30.0–100.0)

## 2016-04-19 ENCOUNTER — Telehealth: Payer: Self-pay | Admitting: Nurse Practitioner

## 2016-04-19 ENCOUNTER — Other Ambulatory Visit: Payer: Self-pay | Admitting: Nurse Practitioner

## 2016-04-19 DIAGNOSIS — Z1211 Encounter for screening for malignant neoplasm of colon: Secondary | ICD-10-CM

## 2016-04-19 NOTE — Progress Notes (Signed)
Referral made to GI for colonoscopy

## 2016-04-19 NOTE — Telephone Encounter (Signed)
Referral made 

## 2016-04-19 NOTE — Telephone Encounter (Signed)
Patient called stating that she needed a order for colonoscopy

## 2016-04-26 ENCOUNTER — Ambulatory Visit: Payer: Commercial Managed Care - HMO | Admitting: Nurse Practitioner

## 2016-04-30 ENCOUNTER — Ambulatory Visit (INDEPENDENT_AMBULATORY_CARE_PROVIDER_SITE_OTHER): Payer: Commercial Managed Care - HMO | Admitting: Nurse Practitioner

## 2016-04-30 ENCOUNTER — Encounter: Payer: Self-pay | Admitting: Nurse Practitioner

## 2016-04-30 VITALS — BP 109/68 | HR 58 | Temp 98.2°F | Ht 61.0 in | Wt 180.0 lb

## 2016-04-30 DIAGNOSIS — H9191 Unspecified hearing loss, right ear: Secondary | ICD-10-CM | POA: Diagnosis not present

## 2016-04-30 DIAGNOSIS — Z23 Encounter for immunization: Secondary | ICD-10-CM

## 2016-04-30 NOTE — Progress Notes (Signed)
   Subjective:    Patient ID: Mariah Fletcher, female    DOB: 1935-04-12, 81 y.o.   MRN: [id number]  HPI Patient comes in today c/o decrease hearing. [facility] her hearing was not good in her right ear. She has not noticed in hearing loss.    Review of Systems  Constitutional: Negative.   HENT: Negative.   Respiratory: Negative.   Cardiovascular: Negative.   Genitourinary: Negative.   Neurological: Negative.   Psychiatric/Behavioral: Negative.   All other systems reviewed and are negative.      Objective:   Physical Exam  Constitutional: She is oriented to person, place, and time. She appears well-developed and well-nourished. No distress.  HENT:  Right Ear: Tympanic membrane, external ear and ear canal normal.  Left Ear: Tympanic membrane, external ear and ear canal normal.  Nose: Nose normal.  Mouth/Throat: Uvula is midline, oropharynx is clear and moist and mucous membranes are normal.  Cardiovascular: Normal rate and regular rhythm.   Pulmonary/Chest: Effort normal.  Neurological: She is alert and oriented to person, place, and time.  Skin: Skin is warm.  Psychiatric: She has a normal mood and affect. Her behavior is normal. Judgment and thought content normal.   BP 109/68   Pulse (!) 58   Temp 98.2 F (36.8 C) (Oral)   Ht 5\' 1"  (1.549 m)   Wt 180 lb (81.6 kg)   BMI 34.01 kg/m         Assessment & Plan:  1. Decreased hearing of right ear will wait on referral - Ambulatory referral to Audiology  Mary-Margaret Daphine DeutscherMartin, FNP

## 2016-04-30 NOTE — Patient Instructions (Signed)
Hearing Loss °Hearing loss is a partial or total loss of the ability to hear. This can be temporary or permanent, and it can happen in one or both ears. Hearing loss may be referred to as deafness. °Medical care is necessary to treat hearing loss properly and to prevent the condition from getting worse. Your hearing may partially or completely come back, depending on what caused your hearing loss and how severe it is. In some cases, hearing loss is permanent. °CAUSES °Common causes of hearing loss include:  °· Too much wax in the ear canal.   °· Infection of the ear canal or middle ear.   °· Fluid in the middle ear.   °· Injury to the ear or surrounding area.   °· An object stuck in the ear.   °· Prolonged exposure to loud sounds, such as music.   °Less common causes of hearing loss include:  °· Tumors in the ear.   °· Viral or bacterial infections, such as meningitis.   °· A hole in the eardrum (perforated eardrum). °· Problems with the hearing nerve that sends signals between the brain and the ear. °· Certain medicines.   °SYMPTOMS  °Symptoms of this condition may include: °· Difficulty telling the difference between sounds. °· Difficulty following a conversation when there is background noise. °· Lack of response to sounds in your environment. This may be most noticeable when you do not respond to startling sounds. °· Needing to turn up the volume on the television, radio, etc. °· Ringing in the ears. °· Dizziness. °· Pain in the ears. °DIAGNOSIS °This condition is diagnosed based on a physical exam and a hearing test (audiometry). The audiometry test will be performed by a hearing specialist (audiologist). You may also be referred to an ear, nose, and throat (ENT) specialist (otolaryngologist).  °TREATMENT °Treatment for recent onset of hearing loss may include:  °· Ear wax removal.   °· Being prescribed medicines to prevent infection (antibiotics).   °· Being prescribed medicines to reduce inflammation  (corticosteroids).   °HOME CARE INSTRUCTIONS °· If you were prescribed an antibiotic medicine, take it as told by your health care provider. Do not stop taking the antibiotic even if you start to feel better. °· Take over-the-counter and prescription medicines only as told by your health care provider. °· Avoid loud noises.   °· Return to your normal activities as told by your health care provider. Ask your health care provider what activities are safe for you. °· Keep all follow-up visits as told by your health care provider. This is important. °SEEK MEDICAL CARE IF:  °· You feel dizzy.   °· You develop new symptoms.   °· You vomit or feel nauseous.   °· You have a fever.   °SEEK IMMEDIATE MEDICAL CARE IF: °· You develop sudden changes in your vision.   °· You have severe ear pain.   °· You have new or increased weakness. °· You have a severe headache. °  °This information is not intended to replace advice given to you by your health care provider. Make sure you discuss any questions you have with your health care provider. °  °Document Released: 07/12/2005 Document Revised: 04/02/2015 Document Reviewed: 11/27/2014 °Elsevier Interactive Patient Education ©2016 Elsevier Inc. ° °

## 2016-06-03 DIAGNOSIS — Z1211 Encounter for screening for malignant neoplasm of colon: Secondary | ICD-10-CM | POA: Diagnosis not present

## 2016-06-03 DIAGNOSIS — K5730 Diverticulosis of large intestine without perforation or abscess without bleeding: Secondary | ICD-10-CM | POA: Diagnosis not present

## 2016-06-03 DIAGNOSIS — K644 Residual hemorrhoidal skin tags: Secondary | ICD-10-CM | POA: Diagnosis not present

## 2016-06-30 ENCOUNTER — Telehealth: Payer: Self-pay | Admitting: Nurse Practitioner

## 2016-07-08 NOTE — Telephone Encounter (Signed)
Pt aware of appointment date/time but will need to reschedule. She was given the number to call to change the appointment to a time that is convenient for her

## 2016-08-17 ENCOUNTER — Ambulatory Visit: Payer: Commercial Managed Care - HMO | Admitting: Audiology

## 2016-08-20 DIAGNOSIS — H9311 Tinnitus, right ear: Secondary | ICD-10-CM | POA: Diagnosis not present

## 2016-08-20 DIAGNOSIS — H903 Sensorineural hearing loss, bilateral: Secondary | ICD-10-CM | POA: Diagnosis not present

## 2016-08-23 ENCOUNTER — Other Ambulatory Visit: Payer: Self-pay

## 2016-08-23 ENCOUNTER — Encounter (INDEPENDENT_AMBULATORY_CARE_PROVIDER_SITE_OTHER): Payer: Commercial Managed Care - HMO | Admitting: Nurse Practitioner

## 2016-08-23 DIAGNOSIS — E785 Hyperlipidemia, unspecified: Secondary | ICD-10-CM | POA: Diagnosis not present

## 2016-08-23 DIAGNOSIS — I10 Essential (primary) hypertension: Secondary | ICD-10-CM | POA: Diagnosis not present

## 2016-08-23 DIAGNOSIS — E210 Primary hyperparathyroidism: Secondary | ICD-10-CM | POA: Diagnosis not present

## 2016-08-23 MED ORDER — RAMIPRIL 5 MG PO CAPS: ORAL_CAPSULE | ORAL | 1 refills | Status: DC

## 2016-08-23 MED ORDER — SIMVASTATIN 20 MG PO TABS
20.0000 mg | ORAL_TABLET | Freq: Every day | ORAL | 1 refills | Status: DC
Start: 2016-08-23 — End: 2017-03-21

## 2016-08-23 NOTE — Progress Notes (Signed)
Erroneous

## 2016-08-24 LAB — CMP14+EGFR
A/G RATIO: 1.1 — AB (ref 1.2–2.2)
ALT: 10 IU/L (ref 0–32)
AST: 12 IU/L (ref 0–40)
Albumin: 3.9 g/dL (ref 3.5–4.8)
Alkaline Phosphatase: 67 IU/L (ref 39–117)
BILIRUBIN TOTAL: 0.5 mg/dL (ref 0.0–1.2)
BUN/Creatinine Ratio: 12 (ref 12–28)
BUN: 9 mg/dL (ref 8–27)
CALCIUM: 11.1 mg/dL — AB (ref 8.7–10.3)
CO2: 25 mmol/L (ref 18–29)
Chloride: 107 mmol/L — ABNORMAL HIGH (ref 96–106)
Creatinine, Ser: 0.75 mg/dL (ref 0.57–1.00)
GFR, EST AFRICAN AMERICAN: 88 mL/min/{1.73_m2} (ref >59)
GFR, EST NON AFRICAN AMERICAN: 76 mL/min/{1.73_m2} (ref >59)
GLOBULIN, TOTAL: 3.6 g/dL (ref 1.5–4.5)
Glucose: 93 mg/dL (ref 65–99)
POTASSIUM: 5.5 mmol/L — AB (ref 3.5–5.2)
SODIUM: 142 mmol/L (ref 134–144)
Total Protein: 7.5 g/dL (ref 6.0–8.5)

## 2016-08-24 LAB — LIPID PANEL
CHOL/HDL RATIO: 2.3 ratio (ref 0.0–4.4)
Cholesterol, Total: 121 mg/dL (ref 100–199)
HDL: 52 mg/dL (ref >39)
LDL Calculated: 50 mg/dL (ref 0–99)
Triglycerides: 96 mg/dL (ref 0–149)
VLDL Cholesterol Cal: 19 mg/dL (ref 5–40)

## 2016-08-24 LAB — TSH: TSH: 2.76 u[IU]/mL (ref 0.450–4.500)

## 2017-03-21 ENCOUNTER — Ambulatory Visit (INDEPENDENT_AMBULATORY_CARE_PROVIDER_SITE_OTHER): Payer: Medicare HMO | Admitting: Nurse Practitioner

## 2017-03-21 ENCOUNTER — Encounter: Payer: Self-pay | Admitting: Nurse Practitioner

## 2017-03-21 VITALS — BP 135/77 | HR 44 | Temp 96.8°F | Ht 61.0 in | Wt 182.0 lb

## 2017-03-21 DIAGNOSIS — M8580 Other specified disorders of bone density and structure, unspecified site: Secondary | ICD-10-CM | POA: Diagnosis not present

## 2017-03-21 DIAGNOSIS — Z1231 Encounter for screening mammogram for malignant neoplasm of breast: Secondary | ICD-10-CM | POA: Diagnosis not present

## 2017-03-21 DIAGNOSIS — E042 Nontoxic multinodular goiter: Secondary | ICD-10-CM

## 2017-03-21 DIAGNOSIS — E785 Hyperlipidemia, unspecified: Secondary | ICD-10-CM

## 2017-03-21 DIAGNOSIS — I10 Essential (primary) hypertension: Secondary | ICD-10-CM

## 2017-03-21 DIAGNOSIS — E210 Primary hyperparathyroidism: Secondary | ICD-10-CM | POA: Diagnosis not present

## 2017-03-21 DIAGNOSIS — Z6833 Body mass index (BMI) 33.0-33.9, adult: Secondary | ICD-10-CM

## 2017-03-21 MED ORDER — SIMVASTATIN 20 MG PO TABS: 20.0000 mg | ORAL_TABLET | Freq: Every day | ORAL | 1 refills | Status: DC

## 2017-03-21 MED ORDER — RAMIPRIL 5 MG PO CAPS: ORAL_CAPSULE | ORAL | 1 refills | Status: DC

## 2017-03-21 NOTE — Progress Notes (Signed)
Subjective:    Patient ID: Mariah Fletcher, female    DOB: [birth year], 82 y.o.   MRN: [id number]  HPI  Mariah Fletcher is here today for follow up of chronic medical problem.  Outpatient Encounter Prescriptions as of 03/21/2017  Medication Sig  . aspirin 81 MG tablet Take 81 mg by mouth daily.  . cholecalciferol (VITAMIN D) 1000 UNITS tablet Take 1 tablet (1,000 Units total) by mouth daily.  . ramipril (ALTACE) 5 MG capsule TAKE 1 CAPSULE (5 MG TOTAL) BY MOUTH DAILY.  . simvastatin (ZOCOR) 20 MG tablet Take 1 tablet (20 mg total) by mouth daily at 6 PM.   No facility-administered encounter medications on file as of 03/21/2017.     1. Essential hypertension, benign  No c/o chest pain ,SOB or headache. Does not check blood pressure at home  2. Multinodular goiter  No problems that she is aware of  3. Hyperparathyroidism, primary ([place])  Will check labs today  4. Osteopenia, unspecified location  Does very little weight bearing exercises  5. Hyperlipidemia with target LDL less than 100  Does not watch diet  6. BMI 33.0-33.9,adult   no recent weight changes    New complaints: none  Social history: Lives alone    Review of Systems  Constitutional: Negative for activity change and appetite change.  HENT: Negative.   Eyes: Negative for pain.  Respiratory: Negative for shortness of breath.   Cardiovascular: Negative for chest pain, palpitations and leg swelling.  Gastrointestinal: Negative for abdominal pain.  Endocrine: Negative for polydipsia.  Genitourinary: Negative.   Skin: Negative for rash.  Neurological: Negative for dizziness, weakness and headaches.  Hematological: Does not bruise/bleed easily.  Psychiatric/Behavioral: Negative.   All other systems reviewed and are negative.      Objective:   Physical Exam  Constitutional: She is oriented to person, place, and time. She appears well-developed and well-nourished.  HENT:  Nose: Nose normal.  Mouth/Throat:  Oropharynx is clear and moist.  Eyes: EOM are normal.  Neck: Trachea normal, normal range of motion and full passive range of motion without pain. Neck supple. No JVD present. Carotid bruit is not present. No thyromegaly present.  Cardiovascular: Normal rate, regular rhythm, normal heart sounds and intact distal pulses.  Exam reveals no gallop and no friction rub.   No murmur heard. Pulmonary/Chest: Effort normal and breath sounds normal.  Abdominal: Soft. Bowel sounds are normal. She exhibits no distension and no mass. There is no tenderness.  Musculoskeletal: Normal range of motion.  Lymphadenopathy:    She has no cervical adenopathy.  Neurological: She is alert and oriented to person, place, and time. She has normal reflexes.  Skin: Skin is warm and dry.  Psychiatric: She has a normal mood and affect. Her behavior is normal. Judgment and thought content normal.   BP 135/77   Pulse (!) 44   Temp (!) 96.8 F (36 C) (Oral)   Ht '5\' 1"'  (1.549 m)   Wt 182 lb (82.6 kg)   BMI 34.39 kg/m         Assessment & Plan:  1. Essential hypertension, benign Low sodium diet - ramipril (ALTACE) 5 MG capsule; TAKE 1 CAPSULE (5 MG TOTAL) BY MOUTH DAILY.  Dispense: 90 capsule; Refill: 1 - CMP14+EGFR  2. Multinodular goiter  3. Hyperparathyroidism, primary ([place])  4. Osteopenia, unspecified location Weight bearing exercises as can tolerate  5. Hyperlipidemia with target LDL less than 100 Low fat diet - simvastatin (ZOCOR) 20 MG tablet;  Take 1 tablet (20 mg total) by mouth daily at 6 PM.  Dispense: 90 tablet; Refill: 1 - Lipid panel  6. BMI 33.0-33.9,adult Discussed diet and exercise for person with BMI >25 Will recheck weight in 3-6 months    Labs pending Health maintenance reviewed Diet and exercise encouraged Continue all meds Follow up  In 6 months   [place], FNP

## 2017-03-21 NOTE — Patient Instructions (Signed)

## 2017-03-21 NOTE — Addendum Note (Signed)
Addended by: Bennie Pierini on: 03/21/2017 11:29 AM   Modules accepted: Orders

## 2017-03-22 LAB — CMP14+EGFR
ALT: 10 [IU]/L (ref 0–32)
AST: 14 [IU]/L (ref 0–40)
Albumin/Globulin Ratio: 1.4 (ref 1.2–2.2)
Albumin: 4.2 g/dL (ref 3.5–4.7)
Alkaline Phosphatase: 72 [IU]/L (ref 39–117)
BUN/Creatinine Ratio: 14 (ref 12–28)
BUN: 9 mg/dL (ref 8–27)
Bilirubin Total: 0.5 mg/dL (ref 0.0–1.2)
CO2: 23 mmol/L (ref 20–29)
Calcium: 11.2 mg/dL — ABNORMAL HIGH (ref 8.7–10.3)
Chloride: 108 mmol/L — ABNORMAL HIGH (ref 96–106)
Creatinine, Ser: 0.63 mg/dL (ref 0.57–1.00)
GFR calc Af Amer: 98 mL/min/{1.73_m2}
GFR calc non Af Amer: 85 mL/min/{1.73_m2}
Globulin, Total: 3.1 g/dL (ref 1.5–4.5)
Glucose: 84 mg/dL (ref 65–99)
Potassium: 5.3 mmol/L — ABNORMAL HIGH (ref 3.5–5.2)
Sodium: 143 mmol/L (ref 134–144)
Total Protein: 7.3 g/dL (ref 6.0–8.5)

## 2017-03-22 LAB — LIPID PANEL
CHOL/HDL RATIO: 2.3 ratio (ref 0.0–4.4)
Cholesterol, Total: 122 mg/dL (ref 100–199)
HDL: 54 mg/dL (ref >39)
LDL Calculated: 46 mg/dL (ref 0–99)
TRIGLYCERIDES: 110 mg/dL (ref 0–149)
VLDL Cholesterol Cal: 22 mg/dL (ref 5–40)

## 2017-04-19 ENCOUNTER — Other Ambulatory Visit: Payer: Medicare HMO

## 2017-04-19 DIAGNOSIS — E875 Hyperkalemia: Secondary | ICD-10-CM

## 2017-04-20 LAB — BMP8+EGFR
BUN / CREAT RATIO: 11 — AB (ref 12–28)
BUN: 7 mg/dL — ABNORMAL LOW (ref 8–27)
CHLORIDE: 105 mmol/L (ref 96–106)
CO2: 25 mmol/L (ref 20–29)
CREATININE: 0.63 mg/dL (ref 0.57–1.00)
Calcium: 10.6 mg/dL — ABNORMAL HIGH (ref 8.7–10.3)
GFR calc Af Amer: 98 mL/min/{1.73_m2} (ref >59)
GFR, EST NON AFRICAN AMERICAN: 85 mL/min/{1.73_m2} (ref >59)
GLUCOSE: 90 mg/dL (ref 65–99)
Potassium: 4.3 mmol/L (ref 3.5–5.2)
Sodium: 142 mmol/L (ref 134–144)

## 2017-08-01 ENCOUNTER — Encounter: Payer: Self-pay | Admitting: Nurse Practitioner

## 2017-08-01 DIAGNOSIS — Z1231 Encounter for screening mammogram for malignant neoplasm of breast: Secondary | ICD-10-CM | POA: Diagnosis not present

## 2017-09-20 ENCOUNTER — Other Ambulatory Visit: Payer: Self-pay | Admitting: Nurse Practitioner

## 2017-09-20 DIAGNOSIS — E785 Hyperlipidemia, unspecified: Secondary | ICD-10-CM

## 2017-09-20 DIAGNOSIS — I10 Essential (primary) hypertension: Secondary | ICD-10-CM

## 2017-09-21 NOTE — Telephone Encounter (Signed)
Last seen 03/21/17

## 2017-09-21 NOTE — Telephone Encounter (Signed)
Last refill without being seen 

## 2017-09-22 ENCOUNTER — Ambulatory Visit: Payer: Medicare HMO | Admitting: Nurse Practitioner

## 2017-09-29 ENCOUNTER — Encounter: Payer: Self-pay | Admitting: Nurse Practitioner

## 2017-09-29 ENCOUNTER — Ambulatory Visit (INDEPENDENT_AMBULATORY_CARE_PROVIDER_SITE_OTHER): Payer: Medicare HMO | Admitting: Nurse Practitioner

## 2017-09-29 VITALS — BP 115/71 | HR 57 | Temp 96.9°F | Ht 61.0 in | Wt 183.0 lb

## 2017-09-29 DIAGNOSIS — E210 Primary hyperparathyroidism: Secondary | ICD-10-CM

## 2017-09-29 DIAGNOSIS — E559 Vitamin D deficiency, unspecified: Secondary | ICD-10-CM

## 2017-09-29 DIAGNOSIS — Z6833 Body mass index (BMI) 33.0-33.9, adult: Secondary | ICD-10-CM

## 2017-09-29 DIAGNOSIS — M8580 Other specified disorders of bone density and structure, unspecified site: Secondary | ICD-10-CM

## 2017-09-29 DIAGNOSIS — R001 Bradycardia, unspecified: Secondary | ICD-10-CM

## 2017-09-29 DIAGNOSIS — I10 Essential (primary) hypertension: Secondary | ICD-10-CM | POA: Diagnosis not present

## 2017-09-29 DIAGNOSIS — E785 Hyperlipidemia, unspecified: Secondary | ICD-10-CM

## 2017-09-29 MED ORDER — RAMIPRIL 5 MG PO CAPS: 5.0000 mg | ORAL_CAPSULE | Freq: Every day | ORAL | 1 refills | Status: DC

## 2017-09-29 MED ORDER — SIMVASTATIN 20 MG PO TABS: 20.0000 mg | ORAL_TABLET | Freq: Every day | ORAL | 1 refills | Status: DC

## 2017-09-29 NOTE — Patient Instructions (Signed)

## 2017-09-29 NOTE — Progress Notes (Signed)
Subjective:    Patient ID: Mariah Fletcher, female    DOB: 08-Oct-1934, 83 y.o.   MRN: [id number]  HPI  Kam Kushnir is here today for follow up of chronic medical problem.  Outpatient Encounter Medications as of 09/29/2017  Medication Sig  . aspirin 81 MG tablet Take 81 mg by mouth daily.  . cholecalciferol (VITAMIN D) 1000 UNITS tablet Take 1 tablet (1,000 Units total) by mouth daily.  . ramipril (ALTACE) 5 MG capsule TAKE 1 CAPSULE (5 MG TOTAL) BY MOUTH DAILY.  . simvastatin (ZOCOR) 20 MG tablet TAKE 1 TABLET (20 MG TOTAL) BY MOUTH DAILY AT 6 PM.     1. Essential hypertension, benign  No c/o chest pain, sob or headache. Does not check blood pressure at home. BP Readings from Last 3 Encounters:  03/21/17 135/77  04/30/16 109/68  02/20/16 138/80     2. Hyperparathyroidism, primary ([place])  Not having any problems that she is aware  3. Osteopenia, unspecified location  Last dexascan was 2015 with tscore of -1.4. She does not want to do repeat dexascan. Denies any back pain.  4. BMI 33.0-33.9,adult  No recent weight changes  5. Bradycardia  denies feeling weak or dizzy  6. Hyperlipidemia with target LDL less than 100  Admits to not watching diet at all  7. Vitamin D deficiency  Takes vitamin d daily    New complaints: None today  Social history: Lives alone    Review of Systems  Constitutional: Negative for activity change and appetite change.  HENT: Negative.   Eyes: Negative for pain.  Respiratory: Negative for shortness of breath.   Cardiovascular: Negative for chest pain, palpitations and leg swelling.  Gastrointestinal: Negative for abdominal pain.  Endocrine: Negative for polydipsia.  Genitourinary: Negative.   Skin: Negative for rash.  Neurological: Negative for dizziness, weakness and headaches.  Hematological: Does not bruise/bleed easily.  Psychiatric/Behavioral: Negative.   All other systems reviewed and are negative.      Objective:   Physical Exam    Constitutional: She is oriented to person, place, and time. She appears well-developed and well-nourished.  HENT:  Nose: Nose normal.  Mouth/Throat: Oropharynx is clear and moist.  Eyes: EOM are normal.  Neck: Trachea normal, normal range of motion and full passive range of motion without pain. Neck supple. No JVD present. Carotid bruit is not present. No thyromegaly present.  Cardiovascular: Normal rate, regular rhythm, normal heart sounds and intact distal pulses. Exam reveals no gallop and no friction rub.  No murmur heard. Pulmonary/Chest: Effort normal and breath sounds normal.  Abdominal: Soft. Bowel sounds are normal. She exhibits no distension and no mass. There is no tenderness.  Musculoskeletal: Normal range of motion.  Lymphadenopathy:    She has no cervical adenopathy.  Neurological: She is alert and oriented to person, place, and time. She has normal reflexes.  Skin: Skin is warm and dry.  Psychiatric: She has a normal mood and affect. Her behavior is normal. Judgment and thought content normal.   BP 115/71   Pulse (!) 57   Temp (!) 96.9 F (36.1 C) (Oral)   Ht '5\' 1"'  (1.549 m)   Wt 183 lb (83 kg)   BMI 34.58 kg/m       Assessment & Plan:  1. Essential hypertension, benign Low sodium diet - ramipril (ALTACE) 5 MG capsule; Take 1 capsule (5 mg total) by mouth daily.  Dispense: 90 capsule; Refill: 1 - CMP14+EGFR  2. Hyperparathyroidism, primary ([place]) *  3. Osteopenia, unspecified location Weight bearing exrcises  4. BMI 33.0-33.9,adult Discussed diet and exercise for person with BMI >25 Will recheck weight in 3-6 months  5. Bradycardia  6. Hyperlipidemia with target LDL less than 100 Low fat diet - simvastatin (ZOCOR) 20 MG tablet; Take 1 tablet (20 mg total) by mouth daily at 6 PM.  Dispense: 90 tablet; Refill: 1 - Lipase  7. Vitamin D deficiency Continue vitamin d and calcium supplement daily    Labs pending Health maintenance reviewed Diet and  exercise encouraged Continue all meds Follow up  In 6 months   [place], FNP

## 2017-09-30 LAB — CMP14+EGFR
ALBUMIN: 4.2 g/dL (ref 3.5–4.7)
ALK PHOS: 69 IU/L (ref 39–117)
ALT: 14 IU/L (ref 0–32)
AST: 17 IU/L (ref 0–40)
Albumin/Globulin Ratio: 1.2 (ref 1.2–2.2)
BILIRUBIN TOTAL: 0.9 mg/dL (ref 0.0–1.2)
BUN / CREAT RATIO: 16 (ref 12–28)
BUN: 10 mg/dL (ref 8–27)
CHLORIDE: 107 mmol/L — AB (ref 96–106)
CO2: 26 mmol/L (ref 20–29)
Calcium: 11.6 mg/dL — ABNORMAL HIGH (ref 8.7–10.3)
Creatinine, Ser: 0.63 mg/dL (ref 0.57–1.00)
GFR calc Af Amer: 98 mL/min/{1.73_m2} (ref >59)
GFR calc non Af Amer: 85 mL/min/{1.73_m2} (ref >59)
GLOBULIN, TOTAL: 3.5 g/dL (ref 1.5–4.5)
GLUCOSE: 88 mg/dL (ref 65–99)
POTASSIUM: 5.2 mmol/L (ref 3.5–5.2)
SODIUM: 145 mmol/L — AB (ref 134–144)
Total Protein: 7.7 g/dL (ref 6.0–8.5)

## 2017-09-30 LAB — LIPASE: LIPASE: 23 U/L (ref 14–85)

## 2017-10-07 ENCOUNTER — Telehealth: Payer: Self-pay | Admitting: Nurse Practitioner

## 2017-10-07 NOTE — Telephone Encounter (Signed)
Aware of results. 

## 2018-03-29 ENCOUNTER — Other Ambulatory Visit: Payer: Self-pay | Admitting: Nurse Practitioner

## 2018-03-29 DIAGNOSIS — I10 Essential (primary) hypertension: Secondary | ICD-10-CM

## 2018-03-31 ENCOUNTER — Ambulatory Visit: Payer: Medicare HMO | Admitting: Nurse Practitioner

## 2018-04-17 ENCOUNTER — Ambulatory Visit (INDEPENDENT_AMBULATORY_CARE_PROVIDER_SITE_OTHER): Payer: Medicare HMO | Admitting: Nurse Practitioner

## 2018-04-17 ENCOUNTER — Encounter: Payer: Self-pay | Admitting: Nurse Practitioner

## 2018-04-17 VITALS — BP 144/74 | HR 46 | Temp 96.9°F | Ht 61.0 in | Wt 177.0 lb

## 2018-04-17 DIAGNOSIS — M8580 Other specified disorders of bone density and structure, unspecified site: Secondary | ICD-10-CM

## 2018-04-17 DIAGNOSIS — E210 Primary hyperparathyroidism: Secondary | ICD-10-CM | POA: Diagnosis not present

## 2018-04-17 DIAGNOSIS — I10 Essential (primary) hypertension: Secondary | ICD-10-CM

## 2018-04-17 DIAGNOSIS — Z6833 Body mass index (BMI) 33.0-33.9, adult: Secondary | ICD-10-CM

## 2018-04-17 DIAGNOSIS — E559 Vitamin D deficiency, unspecified: Secondary | ICD-10-CM | POA: Diagnosis not present

## 2018-04-17 DIAGNOSIS — E785 Hyperlipidemia, unspecified: Secondary | ICD-10-CM

## 2018-04-17 MED ORDER — RAMIPRIL 5 MG PO CAPS: ORAL_CAPSULE | ORAL | 1 refills | Status: DC

## 2018-04-17 MED ORDER — SIMVASTATIN 20 MG PO TABS: 20.0000 mg | ORAL_TABLET | Freq: Every day | ORAL | 1 refills | Status: DC

## 2018-04-17 NOTE — Progress Notes (Signed)
Subjective:    Patient ID: Mariah Fletcher, female    DOB: 14-Jul-1935, 83 y.o.   MRN: [id number]   Chief Complaint: Medical Management of Chronic Issues   HPI:  1. Essential hypertension, benign  No c/o chest pain, sob or headache. Does not check blood pressure at home. BP Readings from Last 3 Encounters:  04/17/18 (!) 144/74  09/29/17 115/71  03/21/17 135/77     2. Hyperparathyroidism, primary [facility])  Not having any problems that she is aware of. Will need to check calcium levels  3. Osteopenia, unspecified location  Last dexascan was 02/20/14. Patient does not want to do them anymore.  4. Hyperlipidemia with target LDL less than 100  Does not watch diet.  5. BMI 33.0-33.9,adult  Weight is down 6lbs from last visit  6. Vitamin D deficiency  She takes daily supplement    Outpatient Encounter Medications as of 04/17/2018  Medication Sig  . aspirin 81 MG tablet Take 81 mg by mouth daily.  . cholecalciferol (VITAMIN D) 1000 UNITS tablet Take 1 tablet (1,000 Units total) by mouth daily.  . ramipril (ALTACE) 5 MG capsule TAKE 1 CAPSULE BY MOUTH EVERY DAY  . simvastatin (ZOCOR) 20 MG tablet Take 1 tablet (20 mg total) by mouth daily at 6 PM.       New complaints: None  today  Social history: Lives by herself. She has family that checks on her daily. [facility]    Review of Systems  Constitutional: Negative for activity change and appetite change.  HENT: Negative.   Eyes: Negative for pain.  Respiratory: Negative for shortness of breath.   Cardiovascular: Negative for chest pain, palpitations and leg swelling.  Gastrointestinal: Negative for abdominal pain.  Endocrine: Negative for polydipsia.  Genitourinary: Negative.   Skin: Negative for rash.  Neurological: Negative for dizziness, weakness and headaches.  Hematological: Does not bruise/bleed easily.  Psychiatric/Behavioral: Negative.   All other systems reviewed and are  negative.      Objective:   Physical Exam  Constitutional: She is oriented to person, place, and time. She appears well-developed and well-nourished. No distress.  HENT:  Head: Normocephalic.  Nose: Nose normal.  Mouth/Throat: Oropharynx is clear and moist.  Eyes: Pupils are equal, round, and reactive to light. EOM are normal.  Neck: Normal range of motion. Neck supple. No JVD present. Carotid bruit is not present.  Cardiovascular: Normal rate, regular rhythm, normal heart sounds and intact distal pulses.  Pulmonary/Chest: Effort normal and breath sounds normal. No respiratory distress. She has no wheezes. She has no rales. She exhibits no tenderness.  Abdominal: Soft. Normal appearance, normal aorta and bowel sounds are normal. She exhibits no distension, no abdominal bruit, no pulsatile midline mass and no mass. There is no splenomegaly or hepatomegaly. There is no tenderness.  Musculoskeletal: Normal range of motion. She exhibits no edema.  Lymphadenopathy:    She has no cervical adenopathy.  Neurological: She is alert and oriented to person, place, and time. She has normal reflexes.  Skin: Skin is warm and dry.  Psychiatric: She has a normal mood and affect. Her behavior is normal. Judgment and thought content normal.  Nursing note and vitals reviewed.  BP (!) 144/74   Pulse (!) 46   Temp (!) 96.9 F (36.1 C) (Oral)   Ht _0  (1.549 m)   Wt 177 lb (80.3 kg)   BMI 33.44 kg/m       Assessment & Plan:  Mariah Fletcher comes in today with chief complaint of Medical Management of Chronic Issues   Diagnosis and orders addressed:  1. Essential hypertension, benign Low sodium diet - ramipril (ALTACE) 5 MG capsule; TAKE 1 CAPSULE BY MOUTH EVERY DAY  Dispense: 90 capsule; Refill: 1 - CMP14+EGFR  2. Hyperparathyroidism, primary ([place]) Will check calcium levels today  3. Osteopenia, unspecified location Weight bearing exercises Will think about having dexascan at next  visit  4. Hyperlipidemia with target LDL less than 100 Low fat diet - simvastatin (ZOCOR) 20 MG tablet; Take 1 tablet (20 mg total) by mouth daily at 6 PM.  Dispense: 90 tablet; Refill: 1 - Lipid panel  5. BMI 33.0-33.9,adult Discussed diet and exercise for person with BMI >25 Will recheck weight in 3-6 months  6. Vitamin D deficiency Continue daily vitamin d supplement   Labs pending Health Maintenance reviewed Diet and exercise encouraged  Follow up plan: 6 months   Mary-Margaret Hassell Done, FNP

## 2018-04-17 NOTE — Patient Instructions (Signed)
Bone Health Bones protect organs, store calcium, and anchor muscles. Good health habits, such as eating nutritious foods and exercising regularly, are important for maintaining healthy bones. They can also help to prevent a condition that causes bones to lose density and become weak and brittle (osteoporosis). Why is bone mass important? Bone mass refers to the amount of bone tissue that you have. The higher your bone mass, the stronger your bones. An important step toward having healthy bones throughout life is to have strong and dense bones during childhood. A young adult who has a high bone mass is more likely to have a high bone mass later in life. Bone mass at its greatest it is called peak bone mass. A large decline in bone mass occurs in older adults. In women, it occurs about the time of menopause. During this time, it is important to practice good health habits, because if more bone is lost than what is replaced, the bones will become less healthy and more likely to break (fracture). If you find that you have a low bone mass, you may be able to prevent osteoporosis or further bone loss by changing your diet and lifestyle. How can I find out if my bone mass is low? Bone mass can be measured with an X-ray test that is called a bone mineral density (BMD) test. This test is recommended for all women who are age 65 or older. It may also be recommended for men who are age 70 or older, or for people who are more likely to develop osteoporosis due to:  Having bones that break easily.  Having a long-term disease that weakens bones, such as kidney disease or rheumatoid arthritis.  Having menopause earlier than normal.  Taking medicine that weakens bones, such as steroids, thyroid hormones, or hormone treatment for breast cancer or prostate cancer.  Smoking.  Drinking three or more alcoholic drinks each day.  What are the nutritional recommendations for healthy bones? To have healthy bones, you  need to get enough of the right minerals and vitamins. Most nutrition experts recommend getting these nutrients from the foods that you eat. Nutritional recommendations vary from person to person. Ask your health care provider what is healthy for you. Here are some general guidelines. Calcium Recommendations Calcium is the most important (essential) mineral for bone health. Most people can get enough calcium from their diet, but supplements may be recommended for people who are at risk for osteoporosis. Good sources of calcium include:  Dairy products, such as low-fat or nonfat milk, cheese, and yogurt.  Dark green leafy vegetables, such as bok choy and broccoli.  Calcium-fortified foods, such as orange juice, cereal, bread, soy beverages, and tofu products.  Nuts, such as almonds.  Follow these recommended amounts for daily calcium intake:  Children, age 1?3: 700 mg.  Children, age 4?8: 1,000 mg.  Children, age 9?13: 1,300 mg.  Teens, age 14?18: 1,300 mg.  Adults, age 19?50: 1,000 mg.  Adults, age 51?70: ? Men: 1,000 mg. ? Women: 1,200 mg.  Adults, age 71 or older: 1,200 mg.  Pregnant and breastfeeding females: ? Teens: 1,300 mg. ? Adults: 1,000 mg.  Vitamin D Recommendations Vitamin D is the most essential vitamin for bone health. It helps the body to absorb calcium. Sunlight stimulates the skin to make vitamin D, so be sure to get enough sunlight. If you live in a cold climate or you do not get outside often, your health care provider may recommend that you take vitamin   D supplements. Good sources of vitamin D in your diet include:  Egg yolks.  Saltwater fish.  Milk and cereal fortified with vitamin D.  Follow these recommended amounts for daily vitamin D intake:  Children and teens, age 1?18: 600 international units.  Adults, age 50 or younger: 400-800 international units.  Adults, age 51 or older: 800-1,000 international units.  Other Nutrients Other nutrients  for bone health include:  Phosphorus. This mineral is found in meat, poultry, dairy foods, nuts, and legumes. The recommended daily intake for adult men and adult women is 700 mg.  Magnesium. This mineral is found in seeds, nuts, dark green vegetables, and legumes. The recommended daily intake for adult men is 400?420 mg. For adult women, it is 310?320 mg.  Vitamin K. This vitamin is found in green leafy vegetables. The recommended daily intake is 120 mg for adult men and 90 mg for adult women.  What type of physical activity is best for building and maintaining healthy bones? Weight-bearing and strength-building activities are important for building and maintaining peak bone mass. Weight-bearing activities cause muscles and bones to work against gravity. Strength-building activities increases muscle strength that supports bones. Weight-bearing and muscle-building activities include:  Walking and hiking.  Jogging and running.  Dancing.  Gym exercises.  Lifting weights.  Tennis and racquetball.  Climbing stairs.  Aerobics.  Adults should get at least 30 minutes of moderate physical activity on most days. Children should get at least 60 minutes of moderate physical activity on most days. Ask your health care provide what type of exercise is best for you. Where can I find more information? For more information, check out the following websites:  National Osteoporosis Foundation: http://nof.org/learn/basics  National Institutes of Health: http://www.niams.nih.gov/Health_Info/Bone/Bone_Health/bone_health_for_life.asp  This information is not intended to replace advice given to you by your health care provider. Make sure you discuss any questions you have with your health care provider. Document Released: 10/02/2003 Document Revised: 01/30/2016 Document Reviewed: 07/17/2014 Elsevier Interactive Patient Education  2018 Elsevier Inc.  

## 2018-04-18 LAB — CMP14+EGFR
A/G RATIO: 1.3 (ref 1.2–2.2)
ALT: 13 IU/L (ref 0–32)
AST: 14 IU/L (ref 0–40)
Albumin: 4.1 g/dL (ref 3.5–4.7)
Alkaline Phosphatase: 62 IU/L (ref 39–117)
BUN/Creatinine Ratio: 15 (ref 12–28)
BUN: 10 mg/dL (ref 8–27)
Bilirubin Total: 0.7 mg/dL (ref 0.0–1.2)
CALCIUM: 10.8 mg/dL — AB (ref 8.7–10.3)
CO2: 27 mmol/L (ref 20–29)
Chloride: 105 mmol/L (ref 96–106)
Creatinine, Ser: 0.68 mg/dL (ref 0.57–1.00)
GFR, EST AFRICAN AMERICAN: 95 mL/min/{1.73_m2} (ref >59)
GFR, EST NON AFRICAN AMERICAN: 82 mL/min/{1.73_m2} (ref >59)
GLOBULIN, TOTAL: 3.1 g/dL (ref 1.5–4.5)
Glucose: 90 mg/dL (ref 65–99)
POTASSIUM: 4.6 mmol/L (ref 3.5–5.2)
SODIUM: 144 mmol/L (ref 134–144)
Total Protein: 7.2 g/dL (ref 6.0–8.5)

## 2018-04-18 LAB — LIPID PANEL
CHOL/HDL RATIO: 2.2 ratio (ref 0.0–4.4)
Cholesterol, Total: 123 mg/dL (ref 100–199)
HDL: 55 mg/dL (ref >39)
LDL Calculated: 47 mg/dL (ref 0–99)
TRIGLYCERIDES: 103 mg/dL (ref 0–149)
VLDL Cholesterol Cal: 21 mg/dL (ref 5–40)

## 2018-05-10 ENCOUNTER — Ambulatory Visit: Payer: Medicare HMO | Admitting: *Deleted

## 2018-05-16 ENCOUNTER — Ambulatory Visit (INDEPENDENT_AMBULATORY_CARE_PROVIDER_SITE_OTHER): Payer: Medicare HMO

## 2018-05-16 ENCOUNTER — Ambulatory Visit (INDEPENDENT_AMBULATORY_CARE_PROVIDER_SITE_OTHER): Payer: Medicare HMO | Admitting: *Deleted

## 2018-05-16 DIAGNOSIS — Z780 Asymptomatic menopausal state: Secondary | ICD-10-CM

## 2018-05-16 DIAGNOSIS — M8588 Other specified disorders of bone density and structure, other site: Secondary | ICD-10-CM | POA: Diagnosis not present

## 2018-06-06 ENCOUNTER — Encounter: Payer: Self-pay | Admitting: *Deleted

## 2018-06-06 ENCOUNTER — Ambulatory Visit (INDEPENDENT_AMBULATORY_CARE_PROVIDER_SITE_OTHER): Payer: Medicare HMO | Admitting: *Deleted

## 2018-06-06 VITALS — BP 135/82 | HR 51 | Ht 61.0 in | Wt 177.0 lb

## 2018-06-06 DIAGNOSIS — Z23 Encounter for immunization: Secondary | ICD-10-CM | POA: Diagnosis not present

## 2018-06-06 DIAGNOSIS — Z0000 Encounter for general adult medical examination without abnormal findings: Secondary | ICD-10-CM

## 2018-06-06 NOTE — Patient Instructions (Addendum)
Please work on your goal of reducing your sugar intake.   At your convenience, please bring a copy of your Healthcare Power of Attorney and Living Will to our office to be filed in your medical record.   Please follow up with Chevis Pretty, FNP as scheduled.  Thank you for coming in for your Annual Wellness Visit today!   Preventive Care 12 Years and Older, Female Preventive care refers to lifestyle choices and visits with your health care provider that can promote health and wellness. What does preventive care include?  A yearly physical exam. This is also called an annual well check.  Dental exams once or twice a year.  Routine eye exams. Ask your health care provider how often you should have your eyes checked.  Personal lifestyle choices, including: ? Daily care of your teeth and gums. ? Regular physical activity. ? Eating a healthy diet. ? Avoiding tobacco and drug use. ? Limiting alcohol use. ? Practicing safe sex. ? Taking low-dose aspirin every day. ? Taking vitamin and mineral supplements as recommended by your health care provider. What happens during an annual well check? The services and screenings done by your health care provider during your annual well check will depend on your age, overall health, lifestyle risk factors, and family history of disease. Counseling Your health care provider may ask you questions about your:  Alcohol use.  Tobacco use.  Drug use.  Emotional well-being.  Home and relationship well-being.  Sexual activity.  Eating habits.  History of falls.  Memory and ability to understand (cognition).  Work and work Statistician.  Reproductive health.  Screening You may have the following tests or measurements:  Height, weight, and BMI.  Blood pressure.  Lipid and cholesterol levels. These may be checked every 5 years, or more frequently if you are over 26 years old.  Skin check.  Lung cancer screening. You may have  this screening every year starting at age 13 if you have a 30-pack-year history of smoking and currently smoke or have quit within the past 15 years.  Fecal occult blood test (FOBT) of the stool. You may have this test every year starting at age 21.  Flexible sigmoidoscopy or colonoscopy. You may have a sigmoidoscopy every 5 years or a colonoscopy every 10 years starting at age 49.  Hepatitis C blood test.  Hepatitis B blood test.  Sexually transmitted disease (STD) testing.  Diabetes screening. This is done by checking your blood sugar (glucose) after you have not eaten for a while (fasting). You may have this done every 1-3 years.  Bone density scan. This is done to screen for osteoporosis. You may have this done starting at age 26.  Mammogram. This may be done every 1-2 years. Talk to your health care provider about how often you should have regular mammograms.  Talk with your health care provider about your test results, treatment options, and if necessary, the need for more tests. Vaccines Your health care provider may recommend certain vaccines, such as:  Influenza vaccine. This is recommended every year.  Tetanus, diphtheria, and acellular pertussis (Tdap, Td) vaccine. You may need a Td booster every 10 years.  Varicella vaccine. You may need this if you have not been vaccinated.  Zoster vaccine. You may need this after age 51.  Measles, mumps, and rubella (MMR) vaccine. You may need at least one dose of MMR if you were born in 1957 or later. You may also need a second dose.  Pneumococcal 13-valent conjugate (PCV13) vaccine. One dose is recommended after age 79.  Pneumococcal polysaccharide (PPSV23) vaccine. One dose is recommended after age 31.  Meningococcal vaccine. You may need this if you have certain conditions.  Hepatitis A vaccine. You may need this if you have certain conditions or if you travel or work in places where you may be exposed to hepatitis  A.  Hepatitis B vaccine. You may need this if you have certain conditions or if you travel or work in places where you may be exposed to hepatitis B.  Haemophilus influenzae type b (Hib) vaccine. You may need this if you have certain conditions.  Talk to your health care provider about which screenings and vaccines you need and how often you need them. This information is not intended to replace advice given to you by your health care provider. Make sure you discuss any questions you have with your health care provider. Document Released: 08/08/2015 Document Revised: 03/31/2016 Document Reviewed: 05/13/2015 Elsevier Interactive Patient Education  [employer].

## 2018-06-07 ENCOUNTER — Encounter: Payer: Self-pay | Admitting: *Deleted

## 2018-06-07 NOTE — Progress Notes (Addendum)
Subjective:   Mariah Fletcher is a 83 y.o. female who presents for an Initial Medicare Annual Wellness Visit.  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  She moved back to [place] in 2005.  She enjoys attending church, and attending the [facility] on weekdays where she visits with friends, eats lunch, exercises, and plays games. She lives alone in an apartment.  She is very close with her sisters and sees them frequently.  Mariah Fletcher has 3 children and 2 grandchildren.  She is provided transportation to appointments and the grocery store by [facility], a public transportation service in [place], [place].  Mariah Fletcher feels her health is unchanged this year from last.  She reports no ER visits, hospitalizations, or surgeries in the past year.   Review of Systems    All negative today        Objective:    Today's Vitals   06/06/18 0959  BP: 135/82  Pulse: (!) 51  Weight: 177 lb (80.3 kg)  Height: 5' 1" (1.549 m)  PainSc: 0-No pain   Body mass index is 33.44 kg/m.  Advanced Directives 06/06/2018 02/24/2015  Does Patient Have a Medical Advance Directive? Yes No  Type of Advance Directive Living will;Healthcare Power of Attorney -  Does patient want to make changes to medical advance directive? No - Patient declined -  Copy of [place] in Chart? No - copy requested -  Would patient like information on creating a medical advance directive? - Yes - Educational materials given    Current Medications (verified) Outpatient Encounter Medications as of 06/06/2018  Medication Sig  . aspirin 81 MG tablet Take 81 mg by mouth daily.  . cholecalciferol (VITAMIN D) 1000 UNITS tablet Take 1 tablet (1,000 Units total) by mouth daily.  . ramipril (ALTACE) 5 MG capsule TAKE 1 CAPSULE BY MOUTH EVERY DAY  . simvastatin (ZOCOR) 20 MG tablet Take 1 tablet (20 mg total) by mouth daily at 6 PM.   No facility-administered encounter  medications on file as of 06/06/2018.     Allergies (verified) Patient has no known allergies.   History: Past Medical History:  Diagnosis Date  . Allergy   . Hyperlipidemia   . Hypertension    Past Surgical History:  Procedure Laterality Date  . ABDOMINAL HYSTERECTOMY  07/26/2005   Family History  Problem Relation Age of Onset  . Hyperparathyroidism Unknown        neg fam hx  . Hypertension Mother   . Arthritis Mother   . Heart disease Mother   . Alcohol abuse Father   . Hypertension Sister   . Arthritis Sister   . Hypertension Brother   . Arthritis Brother   . Hypertension Sister   . Arthritis Sister    Social History   Socioeconomic History  . Marital status: Single    Spouse name: Not on file  . Number of children: Not on file  . Years of education: Not on file  . Highest education level: Not on file  Occupational History  . Occupation: retired  Scientific laboratory technician  . Financial resource strain: Not on file  . Food insecurity:    Worry: Not on file    Inability: Not on file  . Transportation needs:    Medical: Not on file    Non-medical: Not on file  Tobacco Use  . Smoking status: Never Smoker  . Smokeless tobacco:  Never Used  Substance and Sexual Activity  . Alcohol use: Yes    Comment: Rarely drinks wine   . Drug use: Never  . Sexual activity: Not on file  Lifestyle  . Physical activity:    Days per week: Not on file    Minutes per session: Not on file  . Stress: Not on file  Relationships  . Social connections:    Talks on phone: Not on file    Gets together: Not on file    Attends religious service: Not on file    Active member of club or organization: Not on file    Attends meetings of clubs or organizations: Not on file    Relationship status: Not on file  Other Topics Concern  . Not on file  Social History Narrative  . Not on file    Tobacco Counseling Counseling given: No   Clinical Intake:     Pain Score: 0-No pain                   Activities of Daily Living In your present state of health, do you have any difficulty performing the following activities: 06/06/2018  Hearing? N  Vision? N  Difficulty concentrating or making decisions? N  Walking or climbing stairs? N  Dressing or bathing? N  Doing errands, shopping? N  Comment Transportation through [place], [place] and eating ? N  Using the Toilet? N  In the past six months, have you accidently leaked urine? Y  Comment Stress incontinence, wears depends  Do you have problems with loss of bowel control? N  Managing your Medications? N  Managing your Finances? N  Housekeeping or managing your Housekeeping? N  Some recent data might be hidden     Immunizations and Health Maintenance Immunization History  Administered Date(s) Administered  . Influenza Split 05/16/2013  . Influenza, High Dose Seasonal PF 05/20/2014, 05/10/2015, 05/01/2018  . Influenza,inj,Quad PF,6+ Mos 04/30/2016  . Influenza-Unspecified 04/25/2017  . Pneumococcal Conjugate-13 11/18/2014  . Pneumococcal Polysaccharide-23 06/06/2018   There are no preventive care reminders to display for this patient.  Patient Care Team: Chevis Pretty, FNP as PCP - General (Nurse Practitioner)      Assessment:   This is a routine wellness examination for Mariah Fletcher.  Hearing/Vision screen No hearing deficit noted. Patient goes yearly for eye exam to [facility] in [place], [place].  She only wears over the counter glasses for reading.  Dietary issues and exercise activities discussed:  Mariah Fletcher states she eats 2-3 meals per day.  Usually cereal for breakfast, meat, vegetables, and dessert for lunch (usually at the [facility] where she goes daily during the week), and usually a similar supper- grilled chicken or another protein and various sides.  Recommended to Mariah Fletcher a diet of mostly non starchy vegetables, lean proteins,  fruits and whole grains.  Her goal is to start reducing intake of foods with added sugar.   Current Exercise Habits: Structured exercise class, Type of exercise: strength training/weights;stretching;walking;treadmill;Other - see comments(Tai chi), Time (Minutes): 30, Frequency (Times/Week): 2, Weekly Exercise (Minutes/Week): 60, Intensity: Mild  Goals    . DIET - REDUCE SUGAR INTAKE     Reduce intake of baked goods.         Also, foods with added sugar.    Depression Screen PHQ 2/9 Scores 06/06/2018 04/17/2018 09/29/2017 03/21/2017 04/30/2016 02/20/2016 08/21/2015  PHQ - 2 Score 0 0 0 0  0 0 0    Fall Risk Fall Risk  06/07/2018 04/17/2018 09/29/2017 03/21/2017 04/30/2016  Falls in the past year? - No No No No  Number falls in past yr: 0 - - - -    Is the patient's home free of loose throw rugs in walkways, pet beds, electrical cords, etc?   yes      Grab bars in the bathroom? yes      Handrails on the stairs?   yes      Adequate lighting?   yes   Cognitive Function: MMSE - Mini Mental State Exam 06/07/2018  Orientation to time 5  Orientation to Place 5  Registration 3  Attention/ Calculation 5  Recall 2  Language- name 2 objects 2  Language- repeat 1  Language- follow 3 step command 3  Language- read & follow direction 1  Write a sentence 1  Copy design 1  Total score 29        Screening Tests Health Maintenance  Topic Date Due  . MAMMOGRAM  08/02/2019  . TETANUS/TDAP  04/25/2020  . INFLUENZA VACCINE  Completed  . DEXA SCAN  Completed  . PNA vac Low Risk Adult  Completed    Qualifies for Shingles Vaccine? Yes, declined today  Cancer Screenings: Lung: Low Dose CT Chest recommended if Age 43-80 years, 30 pack-year currently smoking OR have quit w/in 15years. Patient does not qualify. Breast: Up to date on Mammogram? No, scheduled for 08/23/18   Up to date of Bone Density/Dexa? Yes Colorectal: up to date  Additional Screenings:  Hepatitis C Screening:  Not  indicated     Plan:     Work on your goal of reducing your sugar intake.  Please bring a copy of your Healthcare Power of Attorney and Living Will to our office to be filed in your medical record.  Follow up with Chevis Pretty, FNP as scheduled. Have mammogram done 08/23/18.  I have personally reviewed and noted the following in the patient's chart:   . Medical and social history . Use of alcohol, tobacco or illicit drugs  . Current medications and supplements . Functional ability and status . Nutritional status . Physical activity . Advanced directives . List of other physicians . Hospitalizations, surgeries, and ER visits in previous 12 months . Vitals . Screenings to include cognitive, depression, and falls . Referrals and appointments  In addition, I have reviewed and discussed with patient certain preventive protocols, quality metrics, and best practice recommendations. A written personalized care plan for preventive services as well as general preventive health recommendations were provided to patient.     ,  M, RN   06/07/2018   I have reviewed and agree with the above AWV documentation.   Mary-Margaret Hassell Done, FNP

## 2018-08-23 DIAGNOSIS — Z1231 Encounter for screening mammogram for malignant neoplasm of breast: Secondary | ICD-10-CM | POA: Diagnosis not present

## 2018-08-23 LAB — HM MAMMOGRAPHY

## 2018-10-16 ENCOUNTER — Ambulatory Visit: Payer: Medicare HMO | Admitting: Nurse Practitioner

## 2018-11-30 ENCOUNTER — Other Ambulatory Visit: Payer: Self-pay | Admitting: Nurse Practitioner

## 2018-11-30 DIAGNOSIS — E785 Hyperlipidemia, unspecified: Secondary | ICD-10-CM

## 2018-11-30 NOTE — Telephone Encounter (Signed)
Pt scheduled with MMM 5/18 at 11 for follow up.

## 2018-11-30 NOTE — Telephone Encounter (Signed)
MMM. NTBS for 6 mos. 30 days approved

## 2018-12-01 ENCOUNTER — Other Ambulatory Visit: Payer: Self-pay | Admitting: Nurse Practitioner

## 2018-12-01 DIAGNOSIS — I10 Essential (primary) hypertension: Secondary | ICD-10-CM

## 2018-12-11 ENCOUNTER — Ambulatory Visit (INDEPENDENT_AMBULATORY_CARE_PROVIDER_SITE_OTHER): Payer: Medicare HMO | Admitting: Nurse Practitioner

## 2018-12-11 ENCOUNTER — Encounter: Payer: Self-pay | Admitting: Nurse Practitioner

## 2018-12-11 ENCOUNTER — Other Ambulatory Visit: Payer: Self-pay

## 2018-12-11 DIAGNOSIS — Z6833 Body mass index (BMI) 33.0-33.9, adult: Secondary | ICD-10-CM

## 2018-12-11 DIAGNOSIS — M8588 Other specified disorders of bone density and structure, other site: Secondary | ICD-10-CM | POA: Diagnosis not present

## 2018-12-11 DIAGNOSIS — E559 Vitamin D deficiency, unspecified: Secondary | ICD-10-CM | POA: Diagnosis not present

## 2018-12-11 DIAGNOSIS — I10 Essential (primary) hypertension: Secondary | ICD-10-CM

## 2018-12-11 DIAGNOSIS — E210 Primary hyperparathyroidism: Secondary | ICD-10-CM | POA: Diagnosis not present

## 2018-12-11 DIAGNOSIS — R001 Bradycardia, unspecified: Secondary | ICD-10-CM

## 2018-12-11 DIAGNOSIS — E785 Hyperlipidemia, unspecified: Secondary | ICD-10-CM

## 2018-12-11 MED ORDER — RAMIPRIL 5 MG PO CAPS: 5.0000 mg | ORAL_CAPSULE | Freq: Every day | ORAL | 1 refills | Status: DC

## 2018-12-11 MED ORDER — SIMVASTATIN 20 MG PO TABS: 20.0000 mg | ORAL_TABLET | Freq: Every day | ORAL | 1 refills | Status: DC

## 2018-12-11 NOTE — Progress Notes (Signed)
Virtual Visit via telephone Note  I connected with Mariah DraftAlice Schwabe on 12/11/18 at 10:50 AM by telephone and verified that I am speaking with the correct person using two identifiers. Mariah Fletcher Keegan is currently located at home and no one is currently with her during visit. The provider, Mary-Margaret Daphine DeutscherMartin, FNP is located in their office at time of visit.  I discussed the limitations, risks, security and privacy concerns of performing an evaluation and management service by telephone and the availability of in person appointments. I also discussed with the patient that there may be a patient responsible charge related to this service. The patient expressed understanding and agreed to proceed.   History and Present Illness:   Chief Complaint: Medical Management of Chronic Issues    HPI:  1. Essential hypertension, benign No c/o chest pain, sob or headache. Does not check blood pressure at home. BP Readings from Last 3 Encounters:  06/06/18 135/82  04/17/18 (!) 144/74  09/29/17 115/71     2. Hyperlipidemia with target LDL less than 100 She does not watch diet and does no exercsie  3. Hyperparathyroidism, primary (HCC) Not having any problems tat she is aware of.  4. Bradycardia No syncopal or near syncopal episodes  5. Vitamin D deficiency Takes daily vitamin d supplement when she remembers  6. Osteopenia of lumbar spine Last dexascan was done 05/16/18 with t score of -1.5. she does no weight bearing exercises  7. BMI 33.0-33.9,adult No recent weight changes    Outpatient Encounter Medications as of 12/11/2018  Medication Sig  . aspirin 81 MG tablet Take 81 mg by mouth daily.  . cholecalciferol (VITAMIN D) 1000 UNITS tablet Take 1 tablet (1,000 Units total) by mouth daily.  . ramipril (ALTACE) 5 MG capsule TAKE 1 CAPSULE BY MOUTH EVERY DAY  . simvastatin (ZOCOR) 20 MG tablet Take 1 tablet (20 mg total) by mouth daily at 6 PM. (Needs to be seen before next refill)        New complaints: None today  Social history: She lives by herself and family checks on her daily     Review of Systems  Constitutional: Negative for diaphoresis and weight loss.  Eyes: Negative for blurred vision, double vision and pain.  Respiratory: Negative for shortness of breath.   Cardiovascular: Negative for chest pain, palpitations, orthopnea and leg swelling.  Gastrointestinal: Negative for abdominal pain.  Skin: Negative for rash.  Neurological: Negative for dizziness, sensory change, loss of consciousness, weakness and headaches.  Endo/Heme/Allergies: Negative for polydipsia. Does not bruise/bleed easily.  Psychiatric/Behavioral: Negative for memory loss. The patient does not have insomnia.   All other systems reviewed and are negative.    Observations/Objective: Alert and oriented- answers all questions apporpriately No distress  Assessment and Plan: Mariah Fletcher Halbur comes in today with chief complaint of Medical Management of Chronic Issues   Diagnosis and orders addressed:  1. Essential hypertension, benign Low sodium diet - ramipril (ALTACE) 5 MG capsule; Take 1 capsule (5 mg total) by mouth daily.  Dispense: 90 capsule; Refill: 1  2. Hyperlipidemia with target LDL less than 100 Low fat diet - simvastatin (ZOCOR) 20 MG tablet; Take 1 tablet (20 mg total) by mouth daily at 6 PM. (Needs to be seen before next refill)  Dispense: 90 tablet; Refill: 1  3. Hyperparathyroidism, primary (HCC) Will check labs at next visit  4. Bradycardia  5. Vitamin D deficiency Continue daily vitamin d supplement  6. Osteopenia of lumbar spine Weight bearing exercises  encouraged  7. BMI 33.0-33.9,adult Discussed diet and exercise for person with BMI >25 Will recheck weight in 3-6 months   Labs pending Health Maintenance reviewed Diet and exercise encouraged  Follow up plan: 3 months      I discussed the assessment and treatment plan with the patient. The patient  was provided an opportunity to ask questions and all were answered. The patient agreed with the plan and demonstrated an understanding of the instructions.   The patient was advised to call back or seek an in-person evaluation if the symptoms worsen or if the condition fails to improve as anticipated.  The above assessment and management plan was discussed with the patient. The patient verbalized understanding of and has agreed to the management plan. Patient is aware to call the clinic if symptoms persist or worsen. Patient is aware when to return to the clinic for a follow-up visit. Patient educated on when it is appropriate to go to the emergency department.   Time call ended:  11:05 AM  I provided 20 minutes of non-face-to-face time during this encounter.    Mary-Margaret Daphine Deutscher, FNP

## 2019-03-15 ENCOUNTER — Other Ambulatory Visit: Payer: Self-pay

## 2019-03-16 ENCOUNTER — Encounter: Payer: Self-pay | Admitting: Nurse Practitioner

## 2019-03-16 ENCOUNTER — Ambulatory Visit (INDEPENDENT_AMBULATORY_CARE_PROVIDER_SITE_OTHER): Payer: Medicare HMO | Admitting: Nurse Practitioner

## 2019-03-16 VITALS — BP 158/78 | HR 50 | Temp 95.7°F | Ht 61.0 in | Wt 175.4 lb

## 2019-03-16 DIAGNOSIS — R001 Bradycardia, unspecified: Secondary | ICD-10-CM | POA: Diagnosis not present

## 2019-03-16 DIAGNOSIS — E785 Hyperlipidemia, unspecified: Secondary | ICD-10-CM

## 2019-03-16 DIAGNOSIS — Z6833 Body mass index (BMI) 33.0-33.9, adult: Secondary | ICD-10-CM

## 2019-03-16 DIAGNOSIS — E210 Primary hyperparathyroidism: Secondary | ICD-10-CM

## 2019-03-16 DIAGNOSIS — I10 Essential (primary) hypertension: Secondary | ICD-10-CM

## 2019-03-16 DIAGNOSIS — M8588 Other specified disorders of bone density and structure, other site: Secondary | ICD-10-CM | POA: Diagnosis not present

## 2019-03-16 MED ORDER — RAMIPRIL 5 MG PO CAPS: 5.0000 mg | ORAL_CAPSULE | Freq: Every day | ORAL | 1 refills | Status: DC

## 2019-03-16 MED ORDER — SIMVASTATIN 20 MG PO TABS: 20.0000 mg | ORAL_TABLET | Freq: Every day | ORAL | 1 refills | Status: DC

## 2019-03-16 NOTE — Patient Instructions (Signed)

## 2019-03-16 NOTE — Progress Notes (Signed)
Subjective:    Patient ID: Mariah Fletcher, female    DOB: 1935-02-18, 84 y.o.   MRN: [id number]   Chief Complaint: Medical Management of Chronic Issues    HPI:  1. Essential hypertension, benign No c/o chest pain, sob or headache. Does chek blood pressure at home and systolic is always below 660.  BP Readings from Last 3 Encounters:  03/16/19 (!) 158/78  06/06/18 135/82  04/17/18 (!) 144/74     2. Hyperlipidemia with target LDL less than 100 Dos not watch diet and does no exercise. Is on daily dose of zocor 58m. Lab Results  Component Value Date   CHOL 123 04/17/2018   HDL 55 04/17/2018   LDLCALC 47 04/17/2018   TRIG 103 04/17/2018   CHOLHDL 2.2 04/17/2018     3. Hyperparathyroidism, primary ([place] No problems that she is aware of. Lab Results  Component Value Date   CALCIUM 10.8 (H) 04/17/2018     4. Bradycardia Denies any fatigue or syncopal episodes  5. Osteopenia of lumbar spine Last dexascan was done 05/16/18 with t score of -1..5. does no weight bearing exercise  6. BMI 33.0-33.9,adult with t score of  No recent weight changes    Outpatient Encounter Medications as of 03/16/2019  Medication Sig  . aspirin 81 MG tablet Take 81 mg by mouth daily.  . cholecalciferol (VITAMIN D) 1000 UNITS tablet Take 1 tablet (1,000 Units total) by mouth daily.  . ramipril (ALTACE) 5 MG capsule Take 1 capsule (5 mg total) by mouth daily.  . simvastatin (ZOCOR) 20 MG tablet Take 1 tablet (20 mg total) by mouth daily at 6 PM. (Needs to be seen before next refill)   No facility-administered encounter medications on file as of 03/16/2019.     Past Surgical History:  Procedure Laterality Date  . ABDOMINAL HYSTERECTOMY  07/26/2005    Family History  Problem Relation Age of Onset  . Hyperparathyroidism Other        neg fam hx  . Hypertension Mother   . Arthritis Mother   . Heart disease Mother   . Alcohol abuse Father   . Hypertension Sister   . Arthritis Sister   .  Hypertension Brother   . Arthritis Brother   . Hypertension Sister   . Arthritis Sister     New complaints: None today  Social history: Lives by herself and family checks on her daily  Controlled substance contract: n/a    Review of Systems  Constitutional: Negative for activity change and appetite change.  HENT: Negative.   Eyes: Negative for pain.  Respiratory: Negative for shortness of breath.   Cardiovascular: Negative for chest pain, palpitations and leg swelling.  Gastrointestinal: Negative for abdominal pain.  Endocrine: Negative for polydipsia.  Genitourinary: Negative.   Skin: Negative for rash.  Neurological: Negative for dizziness, weakness and headaches.  Hematological: Does not bruise/bleed easily.  Psychiatric/Behavioral: Negative.   All other systems reviewed and are negative.      Objective:   Physical Exam Vitals signs and nursing note reviewed.  Constitutional:      General: She is not in acute distress.    Appearance: Normal appearance. She is well-developed.  HENT:     Head: Normocephalic.     Nose: Nose normal.  Eyes:     Pupils: Pupils are equal, round, and reactive to light.  Neck:     Musculoskeletal: Normal range of motion and neck supple.     Vascular: No carotid bruit or  JVD.  Cardiovascular:     Rate and Rhythm: Normal rate and regular rhythm.     Heart sounds: Murmur (2/6 systolic murmur) present.  Pulmonary:     Effort: Pulmonary effort is normal. No respiratory distress.     Breath sounds: Normal breath sounds. No wheezing or rales.  Chest:     Chest wall: No tenderness.  Abdominal:     General: Bowel sounds are normal. There is no distension or abdominal bruit.     Palpations: Abdomen is soft. There is no hepatomegaly, splenomegaly, mass or pulsatile mass.     Tenderness: There is no abdominal tenderness.  Musculoskeletal: Normal range of motion.  Lymphadenopathy:     Cervical: No cervical adenopathy.  Skin:    General:  Skin is warm and dry.  Neurological:     Mental Status: She is alert and oriented to person, place, and time.     Deep Tendon Reflexes: Reflexes are normal and symmetric.  Psychiatric:        Behavior: Behavior normal.        Thought Content: Thought content normal.        Judgment: Judgment normal.       BP (!) 158/78   Pulse (!) 50   Temp (!) 95.7 F (35.4 C) (Temporal)   Ht '5\' 1"'  (1.549 m)   Wt 175 lb 6.4 oz (79.6 kg)   BMI 33.14 kg/m       Assessment & Plan:  Yobana Culliton comes in today with chief complaint of Medical Management of Chronic Issues   Diagnosis and orders addressed:  1. Essential hypertension, benign Low sodium diet - ramipril (ALTACE) 5 MG capsule; Take 1 capsule (5 mg total) by mouth daily.  Dispense: 90 capsule; Refill: 1 - CMP14+EGFR  2. Hyperlipidemia with target LDL less than 100 Low fta diet - simvastatin (ZOCOR) 20 MG tablet; Take 1 tablet (20 mg total) by mouth daily at 6 PM. (Needs to be seen before next refill)  Dispense: 90 tablet; Refill: 1 - Lipid panel  3. Hyperparathyroidism, primary ([place]) Labs pending  4. Bradycardia  5. Osteopenia of lumbar spine Weight bearing exercises encouraged  6. BMI 33.0-33.9,adult Discussed diet and exercise for person with BMI >25 Will recheck weight in 3-6 months   Labs pending Health Maintenance reviewed Diet and exercise encouraged  Follow up plan: 6 months   Mary-Margaret Hassell Done, FNP

## 2019-03-17 LAB — CMP14+EGFR
ALT: 9 IU/L (ref 0–32)
AST: 15 IU/L (ref 0–40)
Albumin/Globulin Ratio: 1.3 (ref 1.2–2.2)
Albumin: 4 g/dL (ref 3.6–4.6)
Alkaline Phosphatase: 63 IU/L (ref 39–117)
BUN/Creatinine Ratio: 16 (ref 12–28)
BUN: 11 mg/dL (ref 8–27)
Bilirubin Total: 0.9 mg/dL (ref 0.0–1.2)
CO2: 24 mmol/L (ref 20–29)
Calcium: 10.8 mg/dL — ABNORMAL HIGH (ref 8.7–10.3)
Chloride: 106 mmol/L (ref 96–106)
Creatinine, Ser: 0.69 mg/dL (ref 0.57–1.00)
GFR calc Af Amer: 94 mL/min/{1.73_m2} (ref >59)
GFR calc non Af Amer: 81 mL/min/{1.73_m2} (ref >59)
Globulin, Total: 3.2 g/dL (ref 1.5–4.5)
Glucose: 86 mg/dL (ref 65–99)
Potassium: 4.9 mmol/L (ref 3.5–5.2)
Sodium: 143 mmol/L (ref 134–144)
Total Protein: 7.2 g/dL (ref 6.0–8.5)

## 2019-03-17 LAB — LIPID PANEL
Chol/HDL Ratio: 2.1 ratio (ref 0.0–4.4)
Cholesterol, Total: 133 mg/dL (ref 100–199)
HDL: 62 mg/dL (ref >39)
LDL Calculated: 51 mg/dL (ref 0–99)
Triglycerides: 98 mg/dL (ref 0–149)
VLDL Cholesterol Cal: 20 mg/dL (ref 5–40)

## 2019-07-12 ENCOUNTER — Ambulatory Visit (INDEPENDENT_AMBULATORY_CARE_PROVIDER_SITE_OTHER): Payer: Medicare HMO | Admitting: *Deleted

## 2019-07-12 DIAGNOSIS — Z0000 Encounter for general adult medical examination without abnormal findings: Secondary | ICD-10-CM

## 2019-07-12 NOTE — Progress Notes (Addendum)
MEDICARE ANNUAL WELLNESS VISIT  07/12/2019  Telephone Visit Disclaimer This Medicare AWV was conducted by telephone due to national recommendations for restrictions regarding the COVID-19 Pandemic (e.g. social distancing).  I verified, using two identifiers, that I am speaking with Mariah Fletcher or their authorized healthcare agent. I discussed the limitations, risks, security, and privacy concerns of performing an evaluation and management service by telephone and the potential availability of an in-person appointment in the future. The patient expressed understanding and agreed to proceed.   Subjective:  Mariah Fletcher is a 84 y.o. female patient of Bennie Pierini, FNP who had a Medicare Annual Wellness Visit today via telephone. Mertice is Retired and lives alone. she has 2 children. she reports that she is not socially active and does interact with friends/family regularly. she is not physically active and enjoys painting and puzzles.  Patient Care Team: Bennie Pierini, FNP as PCP - General (Nurse Practitioner)    Hospital Utilization Over the Past 12 Months: # of hospitalizations or ER visits: 0 # of surgeries: 0  Review of Systems    Patient reports that her overall health is unchanged compared to last year.  History obtained from chart review and the patient  Patient Reported Readings (BP, Pulse, CBG, Weight, etc) none  Pain Assessment Pain : No/denies pain     Current Medications & Allergies (verified) Allergies as of 07/12/2019   No Known Allergies      Medication List        Accurate as of July 12, 2019  1:43 PM. If you have any questions, ask your nurse or doctor.          aspirin 81 MG tablet Take 81 mg by mouth daily.   cholecalciferol 1000 units tablet Commonly known as: VITAMIN D Take 1 tablet (1,000 Units total) by mouth daily.   Fluzone High-Dose Quadrivalent 0.7 ML Susy Generic drug: Influenza Vac High-Dose Quad   ramipril  5 MG capsule Commonly known as: ALTACE Take 1 capsule (5 mg total) by mouth daily.   simvastatin 20 MG tablet Commonly known as: ZOCOR Take 1 tablet (20 mg total) by mouth daily at 6 PM. (Needs to be seen before next refill)        History (reviewed): Past Medical History:  Diagnosis Date   Allergy    Hyperlipidemia    Hypertension    Past Surgical History:  Procedure Laterality Date   ABDOMINAL HYSTERECTOMY  07/26/2005   Family History  Problem Relation Age of Onset   Hyperparathyroidism Other        neg fam hx   Hypertension Mother    Arthritis Mother    Heart disease Mother    Alcohol abuse Father    Hypertension Sister    Arthritis Sister    Hypertension Brother    Arthritis Brother    Hypertension Sister    Arthritis Sister    Social History   Socioeconomic History   Marital status: Single    Spouse name: Not on file   Number of children: 2   Years of education: 12   Highest education level: Not on file  Occupational History   Occupation: retired  Tobacco Use   Smoking status: Never Smoker   Smokeless tobacco: Never Used  Substance and Sexual Activity   Alcohol use: Yes    Comment: Rarely drinks wine    Drug use: Never   Sexual activity: Not on file  Other Topics Concern   Not on file  Social History Narrative   Not on file   Social Determinants of Health   Financial Resource Strain: Low Risk    Difficulty of Paying Living Expenses: Not hard at all  Food Insecurity: No Food Insecurity   Worried About Programme researcher, broadcasting/film/videounning Out of Food in the Last Year: Never true   Baristaan Out of Food in the Last Year: Never true  Transportation Needs: No Transportation Needs   Lack of Transportation (Medical): No   Lack of Transportation (Non-Medical): No  Physical Activity: Inactive   Days of Exercise per Week: 0 days   Minutes of Exercise per Session: 0 min  Stress: No Stress Concern Present   Feeling of Stress : Not at all  Social Connections: Somewhat Isolated    Frequency of Communication with Friends and Family: More than three times a week   Frequency of Social Gatherings with Friends and Family: Once a week   Attends Religious Services: 1 to 4 times per year   Active Member of [employer] or Organizations: No   Attends BankerClub or Organization Meetings: Never   Marital Status: Never married    Activities of Daily Living In your present state of health, do you have any difficulty performing the following activities: 07/12/2019  Hearing? N  Vision? N  Difficulty concentrating or making decisions? N  Walking or climbing stairs? N  Dressing or bathing? N  Doing errands, shopping? Y  Preparing Food and eating ? Y  Using the Toilet? N  In the past six months, have you accidently leaked urine? Y  Do you have problems with loss of bowel control? N  Managing your Medications? N  Managing your Finances? N  Housekeeping or managing your Housekeeping? N  Some recent data might be hidden    Patient Education/ Literacy How often do you need to have someone help you when you read instructions, pamphlets, or other written materials from your doctor or pharmacy?: 1 - Never What is the last grade level you completed in school?: 12  Exercise Current Exercise Habits: The patient does not participate in regular exercise at present, Exercise limited by: None identified  Diet Patient reports consuming 2 meals a day and 2 snack(s) a day Patient reports that her primary diet is: Regular Patient reports that she does have regular access to food.   Depression Screen PHQ 2/9 Scores 07/12/2019 03/16/2019 06/06/2018 04/17/2018 09/29/2017 03/21/2017 04/30/2016  PHQ - 2 Score 0 0 0 0 0 0 0     Fall Risk Fall Risk  07/12/2019 03/16/2019 06/07/2018 04/17/2018 09/29/2017  Falls in the past year? 0 0 - No No  Number falls in past yr: - - 0 - -     Objective:  Mariah Fletcher seemed alert and oriented and she participated appropriately during our telephone visit.  Blood Pressure  Weight BMI  BP Readings from Last 3 Encounters:  03/16/19 (!) 158/78  06/06/18 135/82  04/17/18 (!) 144/74   Wt Readings from Last 3 Encounters:  03/16/19 175 lb 6.4 oz (79.6 kg)  06/06/18 177 lb (80.3 kg)  04/17/18 177 lb (80.3 kg)   BMI Readings from Last 1 Encounters:  03/16/19 33.14 kg/m    *Unable to obtain current vital signs, weight, and BMI due to telephone visit type  Hearing/Vision  Ashya did not seem to have difficulty with hearing/understanding during the telephone conversation Reports that she has had a formal eye exam by an eye care professional within the past year Reports that she has not had  a formal hearing evaluation within the past year *Unable to fully assess hearing and vision during telephone visit type  Cognitive Function: 6CIT Screen 07/12/2019  What Year? 0 points  What month? 0 points  What time? 0 points  Count back from 20 0 points  Months in reverse 0 points  Repeat phrase 2 points  Total Score 2   (Normal:0-7, Significant for Dysfunction: >8)  Normal Cognitive Function Screening: Yes   Immunization & Health Maintenance Record Immunization History  Administered Date(s) Administered   Influenza Split 05/16/2013   Influenza, High Dose Seasonal PF 05/20/2014, 05/10/2015, 05/01/2018, 05/01/2019   Influenza,inj,Quad PF,6+ Mos 04/30/2016   Influenza-Unspecified 04/25/2017   Pneumococcal Conjugate-13 11/18/2014   Pneumococcal Polysaccharide-23 06/06/2018    Health Maintenance  Topic Date Due   TETANUS/TDAP  04/25/2020   MAMMOGRAM  08/23/2020   INFLUENZA VACCINE  Completed   DEXA SCAN  Completed   PNA vac Low Risk Adult  Completed       Assessment  This is a routine wellness examination for [employer].  Health Maintenance: Due or Overdue There are no preventive care reminders to display for this patient.  Yaneisy Wenz does not need a referral for Community Assistance: Care Management:   no Social Work:    no Prescription  Assistance:  no Nutrition/Diabetes Education:  no   Plan:  Personalized Goals Goals Addressed   None    Personalized Health Maintenance & Screening Recommendations  all up to date or no longer needed  Lung Cancer Screening Recommended: no (Low Dose CT Chest recommended if Age 59-80 years, 30 pack-year currently smoking OR have quit w/in past 15 years) Hepatitis C Screening recommended: no HIV Screening recommended: no  Advanced Directives: Written information was not prepared per patient's request.  Referrals & Orders No orders of the defined types were placed in this encounter.   Follow-up Plan Follow-up with Chevis Pretty, FNP as planned on 09/17/19. Patient is still independent with all ADL's, her meals are delivered by meals on wheels. She is not able to drive, but friends and [employer] get her where she needs to be. Patients hearing is ok, but one of her ears ring constantly. Vision good, uses reading glasses only.   I have personally reviewed and noted the following in the patient's chart:   Medical and social history Use of alcohol, tobacco or illicit drugs  Current medications and supplements Functional ability and status Nutritional status Physical activity Advanced directives List of other physicians Hospitalizations, surgeries, and ER visits in previous 12 months Vitals Screenings to include cognitive, depression, and falls Referrals and appointments  In addition, I have reviewed and discussed with [name] certain preventive protocols, quality metrics, and best practice recommendations. A written personalized care plan for preventive services as well as general preventive health recommendations is available and can be mailed to the patient at her request.      Rana Snare, LPN  [id number]   I have reviewed and agree with the above AWV documentation.   Mary-Margaret Hassell Done, FNP

## 2019-09-07 ENCOUNTER — Other Ambulatory Visit: Payer: Self-pay | Admitting: Nurse Practitioner

## 2019-09-07 DIAGNOSIS — I10 Essential (primary) hypertension: Secondary | ICD-10-CM

## 2019-09-14 ENCOUNTER — Other Ambulatory Visit: Payer: Self-pay

## 2019-09-17 ENCOUNTER — Encounter: Payer: Self-pay | Admitting: Nurse Practitioner

## 2019-09-17 ENCOUNTER — Ambulatory Visit (INDEPENDENT_AMBULATORY_CARE_PROVIDER_SITE_OTHER): Payer: Medicare HMO | Admitting: Nurse Practitioner

## 2019-09-17 ENCOUNTER — Other Ambulatory Visit: Payer: Self-pay

## 2019-09-17 VITALS — BP 158/88 | HR 68 | Temp 97.5°F | Resp 20 | Ht 61.0 in | Wt 181.0 lb

## 2019-09-17 DIAGNOSIS — M8588 Other specified disorders of bone density and structure, other site: Secondary | ICD-10-CM

## 2019-09-17 DIAGNOSIS — E785 Hyperlipidemia, unspecified: Secondary | ICD-10-CM | POA: Diagnosis not present

## 2019-09-17 DIAGNOSIS — I10 Essential (primary) hypertension: Secondary | ICD-10-CM

## 2019-09-17 DIAGNOSIS — E559 Vitamin D deficiency, unspecified: Secondary | ICD-10-CM

## 2019-09-17 DIAGNOSIS — R001 Bradycardia, unspecified: Secondary | ICD-10-CM | POA: Diagnosis not present

## 2019-09-17 DIAGNOSIS — E210 Primary hyperparathyroidism: Secondary | ICD-10-CM | POA: Diagnosis not present

## 2019-09-17 DIAGNOSIS — Z6833 Body mass index (BMI) 33.0-33.9, adult: Secondary | ICD-10-CM | POA: Diagnosis not present

## 2019-09-17 MED ORDER — SIMVASTATIN 20 MG PO TABS: 20.0000 mg | ORAL_TABLET | Freq: Every day | ORAL | 1 refills | Status: DC

## 2019-09-17 MED ORDER — RAMIPRIL 10 MG PO CAPS: 10.0000 mg | ORAL_CAPSULE | Freq: Every day | ORAL | 3 refills | Status: DC

## 2019-09-17 NOTE — Patient Instructions (Signed)

## 2019-09-17 NOTE — Progress Notes (Signed)
Subjective:    Patient ID: Mariah Fletcher, female    DOB: [birth year], 85 y.o.   MRN: [id number]   Chief Complaint: Medical Management of Chronic Issues    HPI:  1. Essential hypertension, benign No c/o chest pain, sob or headache. Does not check blood pressure at home. BP Readings from Last 3 Encounters:  09/17/19 (!) 213/103  03/16/19 (!) 158/78  06/06/18 135/82    2. Hyperlipidemia with target LDL less than 100 Does not wcth diet and does very little exercise. Lab Results  Component Value Date   CHOL 133 03/16/2019   HDL 62 03/16/2019   LDLCALC 51 03/16/2019   TRIG 98 03/16/2019   CHOLHDL 2.1 03/16/2019     3. Hyperparathyroidism, primary ([place]) No problems that aware of. Lab Results  Component Value Date   CALCIUM 10.8 (H) 03/16/2019     4. Osteopenia of lumbar spine Does no weight bearing exercise. Has aged out of bone density testing.  5. Bradycardia No syncopial or near syncopial episodes.  6. Vitamin D deficiency Takes vitamin d supplement dialy  7. BMI 33.0-33.9,adult Weight is up 6lbs. Wt Readings from Last 3 Encounters:  09/17/19 181 lb (82.1 kg)  03/16/19 175 lb 6.4 oz (79.6 kg)  06/06/18 177 lb (80.3 kg)   BMI Readings from Last 3 Encounters:  09/17/19 34.20 kg/m  03/16/19 33.14 kg/m  06/06/18 33.44 kg/m       Outpatient Encounter Medications as of 09/17/2019  Medication Sig  . aspirin 81 MG tablet Take 81 mg by mouth daily.  . cholecalciferol (VITAMIN D) 1000 UNITS tablet Take 1 tablet (1,000 Units total) by mouth daily.  . ramipril (ALTACE) 5 MG capsule TAKE 1 CAPSULE BY MOUTH EVERY DAY  . simvastatin (ZOCOR) 20 MG tablet Take 1 tablet (20 mg total) by mouth daily at 6 PM. (Needs to be seen before next refill)    Past Surgical History:  Procedure Laterality Date  . ABDOMINAL HYSTERECTOMY  07/26/2005    Family History  Problem Relation Age of Onset  . Hyperparathyroidism Other        neg fam hx  . Hypertension Mother   .  Arthritis Mother   . Heart disease Mother   . Alcohol abuse Father   . Hypertension Sister   . Arthritis Sister   . Hypertension Brother   . Arthritis Brother   . Hypertension Sister   . Arthritis Sister     New complaints: None today  Social history: Lives by herself. She has family that checks on her daily.  Controlled substance contract: n/a     Review of Systems  Constitutional: Negative for diaphoresis.  Eyes: Negative for pain.  Respiratory: Negative for shortness of breath.   Cardiovascular: Negative for chest pain, palpitations and leg swelling.  Gastrointestinal: Negative for abdominal pain.  Endocrine: Negative for polydipsia.  Skin: Negative for rash.  Neurological: Negative for dizziness, weakness and headaches.  Hematological: Does not bruise/bleed easily.  All other systems reviewed and are negative.      Objective:   Physical Exam Vitals and nursing note reviewed.  Constitutional:      General: She is not in acute distress.    Appearance: Normal appearance. She is well-developed.  HENT:     Head: Normocephalic.     Nose: Nose normal.  Eyes:     Pupils: Pupils are equal, round, and reactive to light.  Neck:     Vascular: No carotid bruit or JVD.  Cardiovascular:  Rate and Rhythm: Normal rate and regular rhythm.     Heart sounds: Murmur present. Systolic murmur present with a grade of 2/6.  Pulmonary:     Effort: Pulmonary effort is normal. No respiratory distress.     Breath sounds: Normal breath sounds. No wheezing or rales.  Chest:     Chest wall: No tenderness.  Abdominal:     General: Bowel sounds are normal. There is no distension or abdominal bruit.     Palpations: Abdomen is soft. There is no hepatomegaly, splenomegaly, mass or pulsatile mass.     Tenderness: There is no abdominal tenderness.  Musculoskeletal:        General: Normal range of motion.     Cervical back: Normal range of motion and neck supple.     Right lower leg:  No edema.     Left lower leg: No edema.  Lymphadenopathy:     Cervical: No cervical adenopathy.  Skin:    General: Skin is warm and dry.  Neurological:     Mental Status: She is alert and oriented to person, place, and time.     Deep Tendon Reflexes: Reflexes are normal and symmetric.  Psychiatric:        Behavior: Behavior normal.        Thought Content: Thought content normal.        Judgment: Judgment normal.    BP (!) 158/88 (BP Location: Left Arm, Cuff Size: Normal)   Pulse 68   Temp (!) 97.5 F (36.4 C) (Temporal)   Resp 20   Ht '5\' 1"'  (1.549 m)   Wt 181 lb (82.1 kg)   BMI 34.20 kg/m         Assessment & Plan:  Grizel Vesely comes in today with chief complaint of Medical Management of Chronic Issues   Diagnosis and orders addressed:  1. Essential hypertension, benign keep diary of blood pressure at homeincreaced dose of ramapril from 52m daiy to 162mdaily Low sodium diet - ramipril (ALTACE) 10 MG capsule; Take 1 capsule (10 mg total) by mouth daily.  Dispense: 90 capsule; Refill: 3 - CBC with Differential/Platelet - CMP14+EGFR  2. Hyperlipidemia with target LDL less than 100 Low fat diet - simvastatin (ZOCOR) 20 MG tablet; Take 1 tablet (20 mg total) by mouth daily at 6 PM. (Needs to be seen before next refill)  Dispense: 90 tablet; Refill: 1 - Lipid panel  3. Hyperparathyroidism, primary ([place] pending  4. Osteopenia of lumbar spine Weight bearing exercise Continue daily vitamin d and calcium supplement  5. Bradycardia  6. Vitamin D deficiency Continue daily supplement  7. BMI 33.0-33.9,adult Discussed diet and exercise for person with BMI >25 Will recheck weight in 3-6 months    Labs pending Health Maintenance reviewed Diet and exercise encouraged  Follow up plan: 6 months   Mary-Margaret MaHassell DoneFNP

## 2019-09-18 LAB — CMP14+EGFR
ALT: 9 IU/L (ref 0–32)
AST: 14 IU/L (ref 0–40)
Albumin/Globulin Ratio: 1.2 (ref 1.2–2.2)
Albumin: 3.8 g/dL (ref 3.6–4.6)
Alkaline Phosphatase: 65 IU/L (ref 39–117)
BUN/Creatinine Ratio: 13 (ref 12–28)
BUN: 9 mg/dL (ref 8–27)
Bilirubin Total: 0.9 mg/dL (ref 0.0–1.2)
CO2: 25 mmol/L (ref 20–29)
Calcium: 10.5 mg/dL — ABNORMAL HIGH (ref 8.7–10.3)
Chloride: 107 mmol/L — ABNORMAL HIGH (ref 96–106)
Creatinine, Ser: 0.72 mg/dL (ref 0.57–1.00)
GFR calc Af Amer: 90 mL/min/{1.73_m2} (ref >59)
GFR calc non Af Amer: 78 mL/min/{1.73_m2} (ref >59)
Globulin, Total: 3.3 g/dL (ref 1.5–4.5)
Glucose: 87 mg/dL (ref 65–99)
Potassium: 4.7 mmol/L (ref 3.5–5.2)
Sodium: 143 mmol/L (ref 134–144)
Total Protein: 7.1 g/dL (ref 6.0–8.5)

## 2019-09-18 LAB — LIPID PANEL
Chol/HDL Ratio: 2.3 ratio (ref 0.0–4.4)
Cholesterol, Total: 145 mg/dL (ref 100–199)
HDL: 64 mg/dL (ref >39)
LDL Chol Calc (NIH): 59 mg/dL (ref 0–99)
Triglycerides: 128 mg/dL (ref 0–149)
VLDL Cholesterol Cal: 22 mg/dL (ref 5–40)

## 2019-09-18 LAB — CBC WITH DIFFERENTIAL/PLATELET
Basophils Absolute: 0.1 10*3/uL (ref 0.0–0.2)
Basos: 1 %
EOS (ABSOLUTE): 0.1 10*3/uL (ref 0.0–0.4)
Eos: 2 %
Hematocrit: 38.2 % (ref 34.0–46.6)
Hemoglobin: 12.8 g/dL (ref 11.1–15.9)
Immature Grans (Abs): 0 10*3/uL (ref 0.0–0.1)
Immature Granulocytes: 0 %
Lymphocytes Absolute: 2.3 10*3/uL (ref 0.7–3.1)
Lymphs: 47 %
MCH: 30.4 pg (ref 26.6–33.0)
MCHC: 33.5 g/dL (ref 31.5–35.7)
MCV: 91 fL (ref 79–97)
Monocytes Absolute: 0.4 10*3/uL (ref 0.1–0.9)
Monocytes: 8 %
Neutrophils Absolute: 2 10*3/uL (ref 1.4–7.0)
Neutrophils: 42 %
Platelets: 180 10*3/uL (ref 150–450)
RBC: 4.21 x10E6/uL (ref 3.77–5.28)
RDW: 13.4 % (ref 11.7–15.4)
WBC: 4.8 10*3/uL (ref 3.4–10.8)

## 2019-12-07 ENCOUNTER — Other Ambulatory Visit: Payer: Self-pay | Admitting: Nurse Practitioner

## 2019-12-07 DIAGNOSIS — I10 Essential (primary) hypertension: Secondary | ICD-10-CM

## 2020-03-13 ENCOUNTER — Other Ambulatory Visit: Payer: Self-pay | Admitting: Nurse Practitioner

## 2020-03-13 DIAGNOSIS — R202 Paresthesia of skin: Secondary | ICD-10-CM | POA: Diagnosis not present

## 2020-03-13 DIAGNOSIS — G609 Hereditary and idiopathic neuropathy, unspecified: Secondary | ICD-10-CM | POA: Diagnosis not present

## 2020-03-13 DIAGNOSIS — M5417 Radiculopathy, lumbosacral region: Secondary | ICD-10-CM | POA: Diagnosis not present

## 2020-03-13 DIAGNOSIS — R634 Abnormal weight loss: Secondary | ICD-10-CM | POA: Diagnosis not present

## 2020-03-13 DIAGNOSIS — G3184 Mild cognitive impairment, so stated: Secondary | ICD-10-CM | POA: Diagnosis not present

## 2020-03-13 DIAGNOSIS — G5603 Carpal tunnel syndrome, bilateral upper limbs: Secondary | ICD-10-CM | POA: Diagnosis not present

## 2020-03-13 DIAGNOSIS — G603 Idiopathic progressive neuropathy: Secondary | ICD-10-CM | POA: Diagnosis not present

## 2020-03-13 DIAGNOSIS — E785 Hyperlipidemia, unspecified: Secondary | ICD-10-CM

## 2020-03-20 ENCOUNTER — Ambulatory Visit: Payer: Self-pay | Admitting: Nurse Practitioner

## 2020-04-21 ENCOUNTER — Other Ambulatory Visit: Payer: Self-pay

## 2020-04-21 ENCOUNTER — Encounter: Payer: Self-pay | Admitting: Nurse Practitioner

## 2020-04-21 ENCOUNTER — Ambulatory Visit (INDEPENDENT_AMBULATORY_CARE_PROVIDER_SITE_OTHER): Payer: Medicare HMO | Admitting: Nurse Practitioner

## 2020-04-21 VITALS — BP 170/83 | HR 42 | Temp 97.4°F | Resp 20 | Ht 61.0 in | Wt 169.0 lb

## 2020-04-21 DIAGNOSIS — I10 Essential (primary) hypertension: Secondary | ICD-10-CM

## 2020-04-21 DIAGNOSIS — Z23 Encounter for immunization: Secondary | ICD-10-CM

## 2020-04-21 DIAGNOSIS — R001 Bradycardia, unspecified: Secondary | ICD-10-CM | POA: Diagnosis not present

## 2020-04-21 DIAGNOSIS — Z6833 Body mass index (BMI) 33.0-33.9, adult: Secondary | ICD-10-CM | POA: Diagnosis not present

## 2020-04-21 DIAGNOSIS — E785 Hyperlipidemia, unspecified: Secondary | ICD-10-CM

## 2020-04-21 LAB — CMP14+EGFR
ALT: 11 IU/L (ref 0–32)
AST: 14 IU/L (ref 0–40)
Albumin/Globulin Ratio: 1.3 (ref 1.2–2.2)
Albumin: 4 g/dL (ref 3.6–4.6)
Alkaline Phosphatase: 57 IU/L (ref 44–121)
BUN/Creatinine Ratio: 16 (ref 12–28)
BUN: 11 mg/dL (ref 8–27)
Bilirubin Total: 1.1 mg/dL (ref 0.0–1.2)
CO2: 24 mmol/L (ref 20–29)
Calcium: 10.9 mg/dL — ABNORMAL HIGH (ref 8.7–10.3)
Chloride: 107 mmol/L — ABNORMAL HIGH (ref 96–106)
Creatinine, Ser: 0.67 mg/dL (ref 0.57–1.00)
GFR calc Af Amer: 94 mL/min/{1.73_m2} (ref >59)
GFR calc non Af Amer: 82 mL/min/{1.73_m2} (ref >59)
Globulin, Total: 3 g/dL (ref 1.5–4.5)
Glucose: 85 mg/dL (ref 65–99)
Potassium: 4.7 mmol/L (ref 3.5–5.2)
Sodium: 142 mmol/L (ref 134–144)
Total Protein: 7 g/dL (ref 6.0–8.5)

## 2020-04-21 LAB — LIPID PANEL
Chol/HDL Ratio: 2.4 ratio (ref 0.0–4.4)
Cholesterol, Total: 155 mg/dL (ref 100–199)
HDL: 65 mg/dL (ref >39)
LDL Chol Calc (NIH): 69 mg/dL (ref 0–99)
Triglycerides: 117 mg/dL (ref 0–149)
VLDL Cholesterol Cal: 21 mg/dL (ref 5–40)

## 2020-04-21 MED ORDER — SIMVASTATIN 20 MG PO TABS: 20.0000 mg | ORAL_TABLET | Freq: Every day | ORAL | 0 refills | Status: DC

## 2020-04-21 NOTE — Patient Instructions (Addendum)
Bradycardia, Adult Bradycardia is a slower-than-normal heartbeat. A normal resting heart rate for an adult ranges from 60 to 100 beats per minute. With bradycardia, the resting heart rate is less than 60 beats per minute. Bradycardia can prevent enough oxygen from reaching certain areas of your body when you are active. It can be serious if it keeps enough oxygen from reaching your brain and other parts of your body. Bradycardia is not a problem for everyone. For some healthy adults, a slow resting heart rate is normal. What are the causes? This condition may be caused by:  A problem with the heart, including: ? A problem with the heart's electrical system, such as a heart block. With a heart block, electrical signals between the chambers of the heart are partially or completely blocked, so they are not able to work as they should. ? A problem with the heart's natural pacemaker (sinus node). ? Heart disease. ? A heart attack. ? Heart damage. ? Lyme disease. ? A heart infection. ? A heart condition that is present at birth (congenital heart defect).  Certain medicines that treat heart conditions.  Certain conditions, such as hypothyroidism and obstructive sleep apnea.  Problems with the balance of chemicals and other substances, like potassium, in the blood.  Trauma.  Radiation therapy. What increases the risk? You are more likely to develop this condition if you:  Are age 65 or older.  Have high blood pressure (hypertension), high cholesterol (hyperlipidemia), or diabetes.  Drink heavily, use tobacco or nicotine products, or use drugs. What are the signs or symptoms? Symptoms of this condition include:  Light-headedness.  Feeling faint or fainting.  Fatigue and weakness.  Trouble with activity or exercise.  Shortness of breath.  Chest pain (angina).  Drowsiness.  Confusion.  Dizziness. How is this diagnosed? This condition may be diagnosed based on:  Your  symptoms.  Your medical history.  A physical exam. During the exam, your health care provider will listen to your heartbeat and check your pulse. To confirm the diagnosis, your health care provider may order tests, such as:  Blood tests.  An electrocardiogram (ECG). This test records the heart's electrical activity. The test can show how fast your heart is beating and whether the heartbeat is steady.  A test in which you wear a portable device (event recorder or Holter monitor) to record your heart's electrical activity while you go about your day.  Anexercise test. How is this treated? Treatment for this condition depends on the cause of the condition and how severe your symptoms are. Treatment may involve:  Treatment of the underlying condition.  Changing your medicines or how much medicine you take.  Having a small, battery-operated device called a pacemaker implanted under the skin. When bradycardia occurs, this device can be used to increase your heart rate and help your heart beat in a regular rhythm. Follow these instructions at home: Lifestyle   Manage any health conditions that contribute to bradycardia as told by your health care provider.  Follow a heart-healthy diet. A nutrition specialist (dietitian) can help educate you about healthy food options and changes.  Follow an exercise program that is approved by your health care provider.  Maintain a healthy weight.  Try to reduce or manage your stress, such as with yoga or meditation. If you need help reducing stress, ask your health care provider.  Do not use any products that contain nicotine or tobacco, such as cigarettes, e-cigarettes, and chewing tobacco. If you need help   quitting, ask your health care provider.  Do not use illegal drugs.  Limit alcohol intake to no more than 1 drink a day for nonpregnant women and 2 drinks a day for men. Be aware of how much alcohol is in your drink. In the [place] one drink  equals one 12 oz bottle of beer (355 mL), one 5 oz glass of wine (148 mL), or one 1 oz glass of hard liquor (44 mL). General instructions  Take over-the-counter and prescription medicines only as told by your health care provider.  Keep all follow-up visits as told by your health care provider. This is important. How is this prevented? In some cases, bradycardia may be prevented by:  Treating underlying medical problems.  Stopping behaviors or medicines that can trigger the condition. Contact a health care provider if you:  Feel light-headed or dizzy.  Almost faint.  Feel weak or are easily fatigued during physical activity.  Experience confusion or have memory problems. Get help right away if:  You faint.  You have: ? An irregular heartbeat (palpitations). ? Chest pain. ? Trouble breathing. Summary  Bradycardia is a slower-than-normal heartbeat. With bradycardia, the resting heart rate is less than 60 beats per minute.  Treatment for this condition depends on the cause.  Manage any health conditions that contribute to bradycardia as told by your health care provider.  Do not use any products that contain nicotine or tobacco, such as cigarettes, e-cigarettes, and chewing tobacco, and limit alcohol intake.  Keep all follow-up visits as told by your health care provider. This is important. This information is not intended to replace advice given to you by your health care provider. Make sure you discuss any questions you have with your health care provider. Document Revised: 01/23/2018 Document Reviewed: 12/21/2017 Elsevier Patient Education  2020 [employer]  

## 2020-04-21 NOTE — Progress Notes (Signed)
Subjective:    Patient ID: Mariah Fletcher, female    DOB: [birth year], 85 y.o.   MRN: [id number]   Chief Complaint: No chief complaint on file.    HPI:  1. Essential hypertension, benign BP Readings from Last 3 Encounters:  04/21/20 (!) 170/83  09/17/19 (!) 158/88  03/16/19 (!) 158/78  Checks blood pressure at home sometimes. Takes medication as prescribed. Denies SOB, chest pain, or headaches. Denies eating heart healthy but avoids salt.     2. Hyperlipidemia with target LDL less than 100 Lab Results  Component Value Date   CHOL 145 09/17/2019   HDL 64 09/17/2019   LDLCALC 59 09/17/2019   TRIG 128 09/17/2019   CHOLHDL 2.3 09/17/2019  Does not avoid fried foods. Takes medication as prescribed.    3. BMI 33.0-33.9,adult BMI Readings from Last 3 Encounters:  04/21/20 31.93 kg/m  09/17/19 34.20 kg/m  03/16/19 33.14 kg/m   Wt Readings from Last 3 Encounters:  04/21/20 169 lb (76.7 kg)  09/17/19 181 lb (82.1 kg)  03/16/19 175 lb 6.4 oz (79.6 kg)  Walks for exercise. [facility].      Outpatient Encounter Medications as of 04/21/2020  Medication Sig  . aspirin 81 MG tablet Take 81 mg by mouth daily.  . cholecalciferol (VITAMIN D) 1000 UNITS tablet Take 1 tablet (1,000 Units total) by mouth daily.  . ramipril (ALTACE) 10 MG capsule Take 1 capsule (10 mg total) by mouth daily.  . simvastatin (ZOCOR) 20 MG tablet TAKE 1 TABLET (20 MG TOTAL) BY MOUTH DAILY AT 6 PM. (NEEDS TO BE SEEN BEFORE NEXT REFILL)   No facility-administered encounter medications on file as of 04/21/2020.    Past Surgical History:  Procedure Laterality Date  . ABDOMINAL HYSTERECTOMY  07/26/2005    Family History  Problem Relation Age of Onset  . Hyperparathyroidism Other        neg fam hx  . Hypertension Mother   . Arthritis Mother   . Heart disease Mother   . Alcohol abuse Father   . Hypertension Sister   . Arthritis Sister   . Hypertension Brother   . Arthritis Brother   .  Hypertension Sister   . Arthritis Sister     New complaints: No new complaints today.   Social history: Lives at home alone, has sister and friends for company.   Controlled substance contract: n/a    Review of Systems  Constitutional: Negative.   HENT: Negative.   Eyes: Negative.   Respiratory: Negative.   Cardiovascular: Negative.   Gastrointestinal: Negative.   Endocrine: Negative.   Genitourinary: Negative.   Musculoskeletal: Negative.   Skin: Negative.   Allergic/Immunologic: Negative.   Neurological: Negative.  Dizziness: occasionally.  Hematological: Negative.   Psychiatric/Behavioral: Negative.   All other systems reviewed and are negative.      Objective:   Physical Exam Vitals and nursing note reviewed.  HENT:     Head: Normocephalic and atraumatic.     Right Ear: Tympanic membrane, ear canal and external ear normal.     Left Ear: Tympanic membrane, ear canal and external ear normal.     Nose: Nose normal.     Mouth/Throat:     Mouth: Mucous membranes are moist.     Pharynx: Oropharynx is clear.  Eyes:     Extraocular Movements: Extraocular movements intact.     Conjunctiva/sclera: Conjunctivae normal.     Pupils: Pupils are equal, round, and reactive to light.  Cardiovascular:  Rate and Rhythm: Regular rhythm. Bradycardia present.     Pulses: Normal pulses.     Heart sounds: Normal heart sounds.  Pulmonary:     Effort: Pulmonary effort is normal.     Breath sounds: Normal breath sounds.  Abdominal:     General: Bowel sounds are normal.  Musculoskeletal:        General: Normal range of motion.     Cervical back: Normal range of motion.  Skin:    General: Skin is warm and dry.     Capillary Refill: Capillary refill takes less than 2 seconds.  Neurological:     General: No focal deficit present.     Mental Status: She is alert and oriented to person, place, and time. Mental status is at baseline.  Psychiatric:        Mood and Affect: Mood  normal.        Behavior: Behavior normal.        Thought Content: Thought content normal.        Judgment: Judgment normal.     BP (!) 170/83   Pulse (!) 42   Temp (!) 97.4 F (36.3 C) (Temporal)   Resp 20   Ht _0  (1.549 m)   Wt 169 lb (76.7 kg)   SpO2 100%   BMI 31.93 kg/m   EKG- sinus Randa Lynn, FNP     Assessment & Plan:  [name] comes in today with chief complaint of No chief complaint on file.   Diagnosis and orders addressed:  1. Essential hypertension, benign Take medication as prescribed. Check blood pressure regularly. Eat a heart healthy diet that is low in salt.   2. Hyperlipidemia with target LDL less than 100 Take medication as prescribed. Avoid foods that are high in fat or fried.   3. BMI 33.0-33.9,adult Exercise regularly. Walking is a great cardiovascular exercise that lower blood pressure and cholesterol levels.   4. Sinus bradycardia Ref to cardiology  Meds ordered this encounter  Medications  . simvastatin (ZOCOR) 20 MG tablet    Sig: Take 1 tablet (20 mg total) by mouth daily at 6 PM. (Needs to be seen before next refill)    Dispense:  90 tablet    Refill:  0    Order Specific Question:   Supervising Provider    Answer:   Caryl Pina A [[id number]]   Orders Placed This Encounter  Procedures  . BMP8+EGFR  . CMP14+EGFR  . Lipid panel  . Ambulatory referral to Cardiology    Referral Priority:   Routine    Referral Type:   Consultation    Referral Reason:   Specialty Services Required    Requested Specialty:   Cardiology    Number of Visits Requested:   1  . EKG 12-Lead    Labs pending Health Maintenance reviewed Diet and exercise encouraged  Follow up plan: Follow up in 6 months.    Mary-Margaret Hassell Done, FNP

## 2020-04-28 DIAGNOSIS — E538 Deficiency of other specified B group vitamins: Secondary | ICD-10-CM | POA: Diagnosis not present

## 2020-04-28 DIAGNOSIS — G3184 Mild cognitive impairment, so stated: Secondary | ICD-10-CM | POA: Diagnosis not present

## 2020-04-28 DIAGNOSIS — M79644 Pain in right finger(s): Secondary | ICD-10-CM | POA: Diagnosis not present

## 2020-04-28 DIAGNOSIS — G5601 Carpal tunnel syndrome, right upper limb: Secondary | ICD-10-CM | POA: Diagnosis not present

## 2020-07-01 NOTE — Progress Notes (Signed)
Cardiology Office Note   Date:  07/02/2020   ID:  Mariah Fletcher, DOB 22-Sep-1934, MRN [id number]  PCP:  Bennie Pierini, FNP  Cardiologist:   Rollene Rotunda, MD Referring:  Bennie Pierini, FNP  Chief Complaint  Patient presents with  . Bradycardia      History of Present Illness: Mariah Fletcher is a 85 y.o. female who is referred by Bennie Pierini, FNP for evaluation of bradycardia.  She has no past cardiac history.  She lives alone.  She has to go up 5 steps to her apartment.  She does her own household chores including vacuuming.  She denies any cardiovascular symptoms except for some rare dizziness.  She does not describe orthostatic symptoms.  She does not have palpitations.  She has had no presyncope or syncope.  She denies any chest pressure, neck or arm discomfort.  She has had no weight gain or edema.  She has noticed her blood pressure has been high.      Past Medical History:  Diagnosis Date  . Allergy   . Hyperlipidemia   . Hypertension     Past Surgical History:  Procedure Laterality Date  . ABDOMINAL HYSTERECTOMY  07/26/2005     Current Outpatient Medications  Medication Sig Dispense Refill  . aspirin 81 MG tablet Take 81 mg by mouth daily.    . cholecalciferol (VITAMIN D) 1000 UNITS tablet Take 1 tablet (1,000 Units total) by mouth daily.    . ramipril (ALTACE) 10 MG capsule Take 1 capsule (10 mg total) by mouth daily. 90 capsule 3  . simvastatin (ZOCOR) 20 MG tablet Take 1 tablet (20 mg total) by mouth daily at 6 PM. (Needs to be seen before next refill) 90 tablet 0  . amLODipine (NORVASC) 2.5 MG tablet Take 1 tablet (2.5 mg total) by mouth daily. 90 tablet 3   No current facility-administered medications for this visit.    Allergies:   Patient has no known allergies.    Social History:  The patient  reports that she has never smoked. She has never used smokeless tobacco. She reports current alcohol use. She reports that she does not use  drugs.   Family History:  The patient's family history includes Alcohol abuse in her father; Arthritis in her brother, mother, sister, and sister; Heart disease in her mother; Hyperparathyroidism in an other family member; Hypertension in her brother, mother, sister, and sister.    ROS:  Please see the history of present illness.   Otherwise, review of systems are positive for none.   All other systems are reviewed and negative.    PHYSICAL EXAM: VS:  BP (!) 164/92   Pulse (!) 53   Ht 5\' 1"  (1.549 m)   Wt 171 lb 3.2 oz (77.7 kg)   SpO2 96%   BMI 32.35 kg/m  , BMI Body mass index is 32.35 kg/m. GENERAL:  Well appearing HEENT:  Pupils equal round and reactive, fundi not visualized, oral mucosa unremarkable NECK:  No jugular venous distention, waveform within normal limits, carotid upstroke brisk and symmetric, no bruits, no thyromegaly LYMPHATICS:  No cervical, inguinal adenopathy LUNGS:  Clear to auscultation bilaterally BACK:  No CVA tenderness CHEST:  Unremarkable HEART:  PMI not displaced or sustained,S1 and S2 within normal limits, no S3, no S4, no clicks, no rubs, soft right upper sternal border systolic murmur early peaking and nonradiating, no diastolic murmurs ABD:  Flat, positive bowel sounds normal in frequency in pitch, no bruits, no rebound,  no guarding, no midline pulsatile mass, no hepatomegaly, no splenomegaly EXT:  2 plus pulses throughout, no edema, no cyanosis no clubbing SKIN:  No rashes no nodules NEURO:  Cranial nerves II through XII grossly intact, motor grossly intact throughout PSYCH:  Cognitively intact, oriented to person place and time    EKG:  EKG is ordered today. The ekg ordered today demonstrates sinus bradycardia, rate 53, left axis deviation, poor anterior R wave progression, no acute ST-T wave changes.   Recent Labs: 09/17/2019: Hemoglobin 12.8; Platelets 180 04/21/2020: ALT 11; BUN 11; Creatinine, Ser 0.67; Potassium 4.7; Sodium 142    Lipid  Panel    Component Value Date/Time   CHOL 155 04/21/2020 1028   TRIG 117 04/21/2020 1028   TRIG 106 11/18/2014 1028   HDL 65 04/21/2020 1028   HDL 54 11/18/2014 1028   CHOLHDL 2.4 04/21/2020 1028   LDLCALC 69 04/21/2020 1028   LDLCALC 67 03/11/2014 1048      Wt Readings from Last 3 Encounters:  07/02/20 171 lb 3.2 oz (77.7 kg)  04/21/20 169 lb (76.7 kg)  09/17/19 181 lb (82.1 kg)      Other studies Reviewed: Additional studies/ records that were reviewed today include: Labs, primary care office records. Review of the above records demonstrates:  Please see elsewhere in the note.     ASSESSMENT AND PLAN:  BRADYCARDIA: She has bradycardia but she does not have any symptoms related to this.  At this point no change in therapy.  HTN: Her blood pressure is elevated.  I will start Norvasc 2.5 mg daily.  She will give me a blood pressure diary in a couple of weeks.  MURMUR: She has a soft apical systolic murmur.  No change in therapy.  DYSLIPIDEMIA: LDL was excellent at 69 with an HDL of 65.  No change in therapy.   Current medicines are reviewed at length with the patient today.  The patient does not have concerns regarding medicines.  The following changes have been made: As above  Labs/ tests ordered today include:   Orders Placed This Encounter  Procedures  . EKG 12-Lead     Disposition:   FU with me as needed   Signed, Rollene Rotunda, MD  07/02/2020 11:29 AM    [facility]

## 2020-07-02 ENCOUNTER — Other Ambulatory Visit: Payer: Self-pay

## 2020-07-02 ENCOUNTER — Ambulatory Visit (INDEPENDENT_AMBULATORY_CARE_PROVIDER_SITE_OTHER): Payer: Medicare HMO | Admitting: Cardiology

## 2020-07-02 ENCOUNTER — Encounter: Payer: Self-pay | Admitting: Cardiology

## 2020-07-02 VITALS — BP 164/92 | HR 53 | Ht 61.0 in | Wt 171.2 lb

## 2020-07-02 DIAGNOSIS — R001 Bradycardia, unspecified: Secondary | ICD-10-CM | POA: Diagnosis not present

## 2020-07-02 DIAGNOSIS — E785 Hyperlipidemia, unspecified: Secondary | ICD-10-CM | POA: Diagnosis not present

## 2020-07-02 DIAGNOSIS — I10 Essential (primary) hypertension: Secondary | ICD-10-CM | POA: Diagnosis not present

## 2020-07-02 MED ORDER — AMLODIPINE BESYLATE 2.5 MG PO TABS: 2.5000 mg | ORAL_TABLET | Freq: Every day | ORAL | 3 refills | Status: DC

## 2020-07-02 NOTE — Patient Instructions (Signed)
Medication Instructions:  Please start Amlodipine 2.5 mg once a day. Continue all other medications as listed.  *If you need a refill on your cardiac medications before your next appointment, please call your pharmacy*  Follow-Up: At [facility], you and your health needs are our priority.  As part of our continuing mission to provide you with exceptional heart care, we have created designated Provider Care Teams.  These Care Teams include your primary Cardiologist (physician) and Advanced Practice Providers (APPs -  Physician Assistants and Nurse Practitioners) who all work together to provide you with the care you need, when you need it.  We recommend signing up for the patient portal called "MyChart".  Sign up information is provided on this After Visit Summary.  MyChart is used to connect with patients for Virtual Visits (Telemedicine).  Patients are able to view lab/test results, encounter notes, upcoming appointments, etc.  Non-urgent messages can be sent to your provider as well.   To learn more about what you can do with MyChart, go to [email or web address].    Your next appointment:   Follow up with Dr Antoine Poche as needed.   Thank you for choosing [facility]!!

## 2020-07-14 DIAGNOSIS — M545 Low back pain, unspecified: Secondary | ICD-10-CM | POA: Diagnosis not present

## 2020-07-14 DIAGNOSIS — G5601 Carpal tunnel syndrome, right upper limb: Secondary | ICD-10-CM | POA: Diagnosis not present

## 2020-07-14 DIAGNOSIS — G3184 Mild cognitive impairment, so stated: Secondary | ICD-10-CM | POA: Diagnosis not present

## 2020-07-14 DIAGNOSIS — M542 Cervicalgia: Secondary | ICD-10-CM | POA: Diagnosis not present

## 2020-07-28 ENCOUNTER — Ambulatory Visit: Payer: Self-pay | Admitting: Nurse Practitioner

## 2020-08-18 ENCOUNTER — Ambulatory Visit: Payer: Self-pay | Admitting: Nurse Practitioner

## 2020-08-19 ENCOUNTER — Encounter: Payer: Self-pay | Admitting: Nurse Practitioner

## 2020-08-19 ENCOUNTER — Other Ambulatory Visit: Payer: Self-pay

## 2020-08-19 ENCOUNTER — Ambulatory Visit (INDEPENDENT_AMBULATORY_CARE_PROVIDER_SITE_OTHER): Payer: Medicare HMO | Admitting: Nurse Practitioner

## 2020-08-19 VITALS — BP 177/85 | HR 49 | Temp 97.1°F | Resp 20 | Ht 61.0 in | Wt 172.0 lb

## 2020-08-19 DIAGNOSIS — E559 Vitamin D deficiency, unspecified: Secondary | ICD-10-CM | POA: Diagnosis not present

## 2020-08-19 DIAGNOSIS — M8588 Other specified disorders of bone density and structure, other site: Secondary | ICD-10-CM

## 2020-08-19 DIAGNOSIS — Z6833 Body mass index (BMI) 33.0-33.9, adult: Secondary | ICD-10-CM | POA: Diagnosis not present

## 2020-08-19 DIAGNOSIS — R001 Bradycardia, unspecified: Secondary | ICD-10-CM | POA: Diagnosis not present

## 2020-08-19 DIAGNOSIS — I10 Essential (primary) hypertension: Secondary | ICD-10-CM | POA: Diagnosis not present

## 2020-08-19 DIAGNOSIS — E785 Hyperlipidemia, unspecified: Secondary | ICD-10-CM

## 2020-08-19 DIAGNOSIS — E210 Primary hyperparathyroidism: Secondary | ICD-10-CM | POA: Diagnosis not present

## 2020-08-19 MED ORDER — SIMVASTATIN 20 MG PO TABS
20.0000 mg | ORAL_TABLET | Freq: Every day | ORAL | 1 refills | Status: DC
Start: 2020-08-19 — End: 2021-01-13

## 2020-08-19 MED ORDER — LISINOPRIL 20 MG PO TABS: 20.0000 mg | ORAL_TABLET | Freq: Every day | ORAL | 3 refills | Status: DC

## 2020-08-19 MED ORDER — AMLODIPINE BESYLATE 2.5 MG PO TABS: 2.5000 mg | ORAL_TABLET | Freq: Every day | ORAL | 3 refills | Status: DC

## 2020-08-19 NOTE — Progress Notes (Signed)
Subjective:    Patient ID: Mariah Fletcher, female    DOB: July 07, 1935, 86 y.o.   MRN: [id number]   Chief Complaint: medical management of chronic issues     HPI:  1. Essential hypertension, benign No c/o chest pain, sob or headaches. She does check blood pressure at home. Usually is in the 754 systolic. BP Readings from Last 3 Encounters:  08/19/20 (!) 177/85  07/02/20 (!) 164/92  04/21/20 (!) 170/83     2. Hyperlipidemia with target LDL less than 100 Doe snot watch diet and doe sno exercise. Lab Results  Component Value Date   CHOL 155 04/21/2020   HDL 65 04/21/2020   LDLCALC 69 04/21/2020   TRIG 117 04/21/2020   CHOLHDL 2.4 04/21/2020     3. Hyperparathyroidism, primary ([place]) No problems that she is aware of.  4. Bradycardia Heart rate is 49. She has been seeing cardiology. Last visit was on 07/02/20 and was not symptomatic , so no changes were made to plan of care. Denies any syncopal or near syncopal episodes. Denies dizziness.  5. Vitamin D deficiency Takes daily vitamin d supplement  6. Osteopenia of lumbar spine Last dexascan was done on 05/16/18 with t score of -1.5. she does no weight bearing exercises.  7. BMI 33.0-33.9,adult No recent weight chnages Wt Readings from Last 3 Encounters:  08/19/20 172 lb (78 kg)  07/02/20 171 lb 3.2 oz (77.7 kg)  04/21/20 169 lb (76.7 kg)   BMI Readings from Last 3 Encounters:  08/19/20 32.50 kg/m  07/02/20 32.35 kg/m  04/21/20 31.93 kg/m       Outpatient Encounter Medications as of 08/19/2020  Medication Sig  . amLODipine (NORVASC) 2.5 MG tablet Take 1 tablet (2.5 mg total) by mouth daily.  Marland Kitchen aspirin 81 MG tablet Take 81 mg by mouth daily.  . cholecalciferol (VITAMIN D) 1000 UNITS tablet Take 1 tablet (1,000 Units total) by mouth daily.  . ramipril (ALTACE) 10 MG capsule Take 1 capsule (10 mg total) by mouth daily.  . simvastatin (ZOCOR) 20 MG tablet Take 1 tablet (20 mg total) by mouth daily at 6 PM. (Needs to  be seen before next refill)     Past Surgical History:  Procedure Laterality Date  . ABDOMINAL HYSTERECTOMY  07/26/2005    Family History  Problem Relation Age of Onset  . Hyperparathyroidism Other        neg fam hx  . Hypertension Mother   . Arthritis Mother   . Heart disease Mother   . Alcohol abuse Father   . Hypertension Sister   . Arthritis Sister   . Hypertension Brother   . Arthritis Brother   . Hypertension Sister   . Arthritis Sister     New complaints: None today  Social history: Lives by herself. Has family that check son her daily  Controlled substance contract: n/a    Review of Systems  Constitutional: Negative for diaphoresis.  Eyes: Negative for pain.  Respiratory: Negative for shortness of breath.   Cardiovascular: Negative for chest pain, palpitations and leg swelling.  Gastrointestinal: Negative for abdominal pain.  Endocrine: Negative for polydipsia.  Skin: Negative for rash.  Neurological: Negative for dizziness, weakness and headaches.  Hematological: Does not bruise/bleed easily.  All other systems reviewed and are negative.      Objective:   Physical Exam Vitals and nursing note reviewed.  Constitutional:      General: She is not in acute distress.    Appearance: Normal appearance.  She is well-developed and well-nourished.  HENT:     Head: Normocephalic.     Nose: Nose normal.     Mouth/Throat:     Mouth: Oropharynx is clear and moist.  Eyes:     Extraocular Movements: EOM normal.     Pupils: Pupils are equal, round, and reactive to light.  Neck:     Vascular: No carotid bruit or JVD.  Cardiovascular:     Rate and Rhythm: Normal rate and regular rhythm.     Pulses: Intact distal pulses.     Heart sounds: Normal heart sounds.  Pulmonary:     Effort: Pulmonary effort is normal. No respiratory distress.     Breath sounds: Normal breath sounds. No wheezing or rales.  Chest:     Chest wall: No tenderness.  Abdominal:      General: Bowel sounds are normal. There is no distension or abdominal bruit. Aorta is normal.     Palpations: Abdomen is soft. There is no hepatomegaly, splenomegaly, mass or pulsatile mass.     Tenderness: There is no abdominal tenderness.  Musculoskeletal:        General: No edema. Normal range of motion.     Cervical back: Normal range of motion and neck supple.  Lymphadenopathy:     Cervical: No cervical adenopathy.  Skin:    General: Skin is warm and dry.  Neurological:     Mental Status: She is alert and oriented to person, place, and time.     Deep Tendon Reflexes: Reflexes are normal and symmetric.  Psychiatric:        Mood and Affect: Mood and affect normal.        Behavior: Behavior normal.        Thought Content: Thought content normal.        Judgment: Judgment normal.       BP (!) 177/85   Pulse (!) 49   Temp (!) 97.1 F (36.2 C) (Temporal)   Resp 20   Ht '5\' 1"'  (1.549 m)   Wt 172 lb (78 kg)   SpO2 98%   BMI 32.50 kg/m      Assessment & Plan:  [name] comes in today with chief complaint of Medical Management of Chronic Issues   Diagnosis and orders addressed:  1. Essential hypertension, benign changed ramapril 66m to lisinopril 276mdaily Keep check of blood pressure at home - lisinopril (ZESTRIL) 20 MG tablet; Take 1 tablet (20 mg total) by mouth daily.  Dispense: 90 tablet; Refill: 3 - amLODipine (NORVASC) 2.5 MG tablet; Take 1 tablet (2.5 mg total) by mouth daily.  Dispense: 90 tablet; Refill: 3 - CBC with Differential/Platelet - CMP14+EGFR  2. Hyperlipidemia with target LDL less than 100 Low fat diet - simvastatin (ZOCOR) 20 MG tablet; Take 1 tablet (20 mg total) by mouth daily at 6 PM. (Needs to be seen before next refill)  Dispense: 90 tablet; Refill: 1 - Lipid panel  3. [place]  4. Bradycardia Keep follow up with cardiology If becomes dizzy or has syncopial episode need to go to the ED  5.  Vitamin D deficiency Continue daily vitamin d supplement  6. Osteopenia of lumbar spine Weight bearing exercises  7. BMI 33.0-33.9,adult Discussed diet and exercise for person with BMI >25 Will recheck weight in 3-6 months   Labs pending Health Maintenance reviewed Diet and exercise encouraged  Follow up plan: 1 months- bp check   MaNew EuchaFNP

## 2020-08-19 NOTE — Patient Instructions (Signed)

## 2020-08-20 LAB — CMP14+EGFR
ALT: 10 IU/L (ref 0–32)
AST: 16 IU/L (ref 0–40)
Albumin/Globulin Ratio: 1.3 (ref 1.2–2.2)
Albumin: 4 g/dL (ref 3.6–4.6)
Alkaline Phosphatase: 60 IU/L (ref 44–121)
BUN/Creatinine Ratio: 14 (ref 12–28)
BUN: 10 mg/dL (ref 8–27)
Bilirubin Total: 0.8 mg/dL (ref 0.0–1.2)
CO2: 24 mmol/L (ref 20–29)
Calcium: 10.8 mg/dL — ABNORMAL HIGH (ref 8.7–10.3)
Chloride: 104 mmol/L (ref 96–106)
Creatinine, Ser: 0.71 mg/dL (ref 0.57–1.00)
GFR calc Af Amer: 91 mL/min/{1.73_m2} (ref >59)
GFR calc non Af Amer: 79 mL/min/{1.73_m2} (ref >59)
Globulin, Total: 3.1 g/dL (ref 1.5–4.5)
Glucose: 80 mg/dL (ref 65–99)
Potassium: 4.3 mmol/L (ref 3.5–5.2)
Sodium: 140 mmol/L (ref 134–144)
Total Protein: 7.1 g/dL (ref 6.0–8.5)

## 2020-08-20 LAB — CBC WITH DIFFERENTIAL/PLATELET
Basophils Absolute: 0.1 10*3/uL (ref 0.0–0.2)
Basos: 1 %
EOS (ABSOLUTE): 0.1 10*3/uL (ref 0.0–0.4)
Eos: 2 %
Hematocrit: 36.9 % (ref 34.0–46.6)
Hemoglobin: 12.4 g/dL (ref 11.1–15.9)
Immature Grans (Abs): 0 10*3/uL (ref 0.0–0.1)
Immature Granulocytes: 0 %
Lymphocytes Absolute: 2.3 10*3/uL (ref 0.7–3.1)
Lymphs: 44 %
MCH: 30 pg (ref 26.6–33.0)
MCHC: 33.6 g/dL (ref 31.5–35.7)
MCV: 89 fL (ref 79–97)
Monocytes Absolute: 0.4 10*3/uL (ref 0.1–0.9)
Monocytes: 8 %
Neutrophils Absolute: 2.4 10*3/uL (ref 1.4–7.0)
Neutrophils: 45 %
Platelets: 183 10*3/uL (ref 150–450)
RBC: 4.13 x10E6/uL (ref 3.77–5.28)
RDW: 12.3 % (ref 11.7–15.4)
WBC: 5.2 10*3/uL (ref 3.4–10.8)

## 2020-08-20 LAB — LIPID PANEL
Chol/HDL Ratio: 2 ratio (ref 0.0–4.4)
Cholesterol, Total: 140 mg/dL (ref 100–199)
HDL: 71 mg/dL (ref >39)
LDL Chol Calc (NIH): 54 mg/dL (ref 0–99)
Triglycerides: 74 mg/dL (ref 0–149)
VLDL Cholesterol Cal: 15 mg/dL (ref 5–40)

## 2020-08-20 LAB — PTH, INTACT AND CALCIUM: PTH: 70 pg/mL — ABNORMAL HIGH (ref 15–65)

## 2020-09-15 DIAGNOSIS — G603 Idiopathic progressive neuropathy: Secondary | ICD-10-CM | POA: Diagnosis not present

## 2020-09-15 DIAGNOSIS — G5603 Carpal tunnel syndrome, bilateral upper limbs: Secondary | ICD-10-CM | POA: Diagnosis not present

## 2020-09-15 DIAGNOSIS — M5412 Radiculopathy, cervical region: Secondary | ICD-10-CM | POA: Diagnosis not present

## 2020-09-15 DIAGNOSIS — R202 Paresthesia of skin: Secondary | ICD-10-CM | POA: Diagnosis not present

## 2020-09-15 DIAGNOSIS — G3184 Mild cognitive impairment, so stated: Secondary | ICD-10-CM | POA: Diagnosis not present

## 2020-09-15 DIAGNOSIS — R270 Ataxia, unspecified: Secondary | ICD-10-CM | POA: Diagnosis not present

## 2020-09-19 ENCOUNTER — Ambulatory Visit: Payer: Self-pay | Admitting: Nurse Practitioner

## 2020-09-22 ENCOUNTER — Other Ambulatory Visit: Payer: Self-pay | Admitting: Nurse Practitioner

## 2020-09-22 DIAGNOSIS — I10 Essential (primary) hypertension: Secondary | ICD-10-CM

## 2020-09-26 ENCOUNTER — Encounter: Payer: Self-pay | Admitting: Nurse Practitioner

## 2020-09-26 ENCOUNTER — Other Ambulatory Visit: Payer: Self-pay

## 2020-09-26 ENCOUNTER — Ambulatory Visit (INDEPENDENT_AMBULATORY_CARE_PROVIDER_SITE_OTHER): Payer: Medicare HMO | Admitting: Nurse Practitioner

## 2020-09-26 VITALS — BP 138/80 | HR 52 | Temp 97.5°F | Resp 20 | Ht 61.0 in | Wt 170.0 lb

## 2020-09-26 DIAGNOSIS — I10 Essential (primary) hypertension: Secondary | ICD-10-CM

## 2020-09-26 NOTE — Patient Instructions (Signed)

## 2020-09-26 NOTE — Progress Notes (Signed)
   Subjective:    Patient ID: Mariah Fletcher, female    DOB: 08/02/1934, 86 y.o.   MRN: [id number]   Chief Complaint: hypertension  HPI Patient was seen on 08/19/20 for medical management of chronic conditions. Her blood pressure was elevated at that time. We her form ramipril 10mg  to lisinopril 20mg  daily. She was to continue her  norvasc as prescribed. Her blood pressures at home are running in 140 systolic most of the time.   BP Readings from Last 3 Encounters:  09/26/20 (!) 146/79  08/19/20 (!) 177/85  07/02/20 (!) 164/92    Review of Systems  Constitutional: Negative for diaphoresis.  Eyes: Negative for pain.  Respiratory: Negative for shortness of breath.   Cardiovascular: Negative for chest pain, palpitations and leg swelling.  Gastrointestinal: Negative for abdominal pain.  Endocrine: Negative for polydipsia.  Skin: Negative for rash.  Neurological: Negative for dizziness, weakness and headaches.  Hematological: Does not bruise/bleed easily.  All other systems reviewed and are negative.      Objective:   Physical Exam Vitals and nursing note reviewed.  Constitutional:      Appearance: Normal appearance.  Cardiovascular:     Rate and Rhythm: Normal rate and regular rhythm.     Heart sounds: Normal heart sounds.  Pulmonary:     Breath sounds: Normal breath sounds.  Neurological:     General: No focal deficit present.     Mental Status: She is alert.  Psychiatric:        Mood and Affect: Mood normal.        Behavior: Behavior normal.     BP 138/80   Pulse (!) 52   Temp (!) 97.5 F (36.4 C) (Temporal)   Resp 20   Ht 5\' 1"  (1.549 m)   Wt 170 lb (77.1 kg)   SpO2 98%   BMI 32.12 kg/m         Assessment & Plan:  Mariah Fletcher in today with chief complaint of Hypertension   1. Primary hypertension Continue current meds Continue to keep diary of blood pressure at home Low sodium diet Follow up in 3 months    The above assessment and management plan  was discussed with the patient. The patient verbalized understanding of and has agreed to the management plan. Patient is aware to call the clinic if symptoms persist or worsen. Patient is aware when to return to the clinic for a follow-up visit. Patient educated on when it is appropriate to go to the emergency department.   Mariah Fletcher 14/08/21, FNP

## 2020-11-05 DIAGNOSIS — M4316 Spondylolisthesis, lumbar region: Secondary | ICD-10-CM | POA: Diagnosis not present

## 2020-11-05 DIAGNOSIS — M48061 Spinal stenosis, lumbar region without neurogenic claudication: Secondary | ICD-10-CM | POA: Diagnosis not present

## 2020-11-05 DIAGNOSIS — Z7982 Long term (current) use of aspirin: Secondary | ICD-10-CM | POA: Diagnosis not present

## 2020-11-05 DIAGNOSIS — I10 Essential (primary) hypertension: Secondary | ICD-10-CM | POA: Diagnosis not present

## 2020-11-05 DIAGNOSIS — M5136 Other intervertebral disc degeneration, lumbar region: Secondary | ICD-10-CM | POA: Diagnosis not present

## 2020-11-05 DIAGNOSIS — Z79899 Other long term (current) drug therapy: Secondary | ICD-10-CM | POA: Diagnosis not present

## 2020-11-05 DIAGNOSIS — M47816 Spondylosis without myelopathy or radiculopathy, lumbar region: Secondary | ICD-10-CM | POA: Diagnosis not present

## 2020-11-05 DIAGNOSIS — N1330 Unspecified hydronephrosis: Secondary | ICD-10-CM | POA: Diagnosis not present

## 2020-11-05 DIAGNOSIS — E7800 Pure hypercholesterolemia, unspecified: Secondary | ICD-10-CM | POA: Diagnosis not present

## 2020-11-05 DIAGNOSIS — M5137 Other intervertebral disc degeneration, lumbosacral region: Secondary | ICD-10-CM | POA: Diagnosis not present

## 2020-11-12 ENCOUNTER — Encounter: Payer: Self-pay | Admitting: Nurse Practitioner

## 2020-11-12 ENCOUNTER — Ambulatory Visit (INDEPENDENT_AMBULATORY_CARE_PROVIDER_SITE_OTHER): Payer: Medicare HMO | Admitting: Nurse Practitioner

## 2020-11-12 ENCOUNTER — Other Ambulatory Visit: Payer: Self-pay

## 2020-11-12 VITALS — BP 153/84 | HR 57 | Temp 96.9°F | Resp 20 | Ht 61.0 in | Wt 166.0 lb

## 2020-11-12 DIAGNOSIS — M5136 Other intervertebral disc degeneration, lumbar region: Secondary | ICD-10-CM | POA: Diagnosis not present

## 2020-11-12 DIAGNOSIS — Z09 Encounter for follow-up examination after completed treatment for conditions other than malignant neoplasm: Secondary | ICD-10-CM | POA: Diagnosis not present

## 2020-11-12 NOTE — Patient Instructions (Signed)
Degenerative Disk Disease  Degenerative disk disease is a condition caused by changes that occur in the spinal disks as a person ages. Spinal disks are soft and compressible disks located between the bones of your spine (vertebrae). These disks act like shock absorbers. Degenerative disk disease can affect the whole spine. However, the neck and lower back are most often affected. Many changes can occur in the spinal disks with aging, such as:  The spinal disks may dry and shrink.  Small tears may occur in the tough, outer covering of the disk (annulus).  The disk space may become smaller due to loss of water.  Abnormal growths in the bone (spurs) may occur. This can put pressure on the nerve roots exiting the spinal canal, causing pain.  The spinal canal may become narrowed. What are the causes? This condition may be caused by:  Normal degeneration with age.  Injuries.  Certain activities and sports that cause damage. What increases the risk? The following factors may make you more likely to develop this condition:  Being overweight.  Having a family history of degenerative disk disease.  Smoking and use of products that contain nicotine and tobacco.  Sudden injury.  Doing work that requires heavy lifting. What are the signs or symptoms? Symptoms of this condition include:  Pain that varies in intensity. Some people have no pain, while others have severe pain. The location of the pain depends on the part of your backbone that is affected. You may have: ? Pain in your neck or arm if a disk in your neck area is affected. ? Pain in your back, buttocks, or legs if a disk in your lower back is affected.  Pain that becomes worse while bending or reaching up, or with twisting movements.  Pain that may start gradually and worsen as time passes. It may also start after a major or minor injury.  Numbness or tingling in the arms or legs. How is this diagnosed? This condition may  be diagnosed based on:  Your symptoms and medical history.  A physical exam.  Imaging tests, including: ? X-ray of the spine. ? CT scan. ? MRI. How is this treated? This condition may be treated with:  Medicines.  Injection of steroids into the back.  Rehabilitation exercises. These activities aim to strengthen muscles in your back and abdomen to better support your spine. If treatments do not help to relieve your symptoms or you have severe pain, you may need surgery. Follow these instructions at home: Medicines  Take over-the-counter and prescription medicines only as told by your health care provider.  Ask your health care provider if the medicine prescribed to you: ? Requires you to avoid driving or using machinery. ? Can cause constipation. You may need to take these actions to prevent or treat constipation:  Drink enough fluid to keep your urine pale yellow.  Take over-the-counter or prescription medicines.  Eat foods that are high in fiber, such as beans, whole grains, and fresh fruits and vegetables.  Limit foods that are high in fat and processed sugars, such as fried or sweet foods. Activity  Rest as told by your health care provider.  Avoid sitting for a long time without moving. Get up to take short walks every 1-2 hours. This is important to improve blood flow and breathing. Ask for help if you feel weak or unsteady.  Return to your normal activities as told by your health care provider. Ask your health care provider what activities   are safe for you.  Perform relaxation exercises as told by your health care provider.  Maintain good posture.  Do not lift anything that is heavier than 10 lb (4.5 kg), or the limit that you are told, until your health care provider says that it is safe.  Follow proper lifting and walking techniques as told by your health care provider. Managing pain, stiffness, and swelling  If directed, put ice on the painful area. Icing  can help to relieve pain. To do this: ? Put ice in a plastic bag. ? Place a towel between your skin and the bag. ? Leave the ice on for 20 minutes, 2-3 times a day. ? Remove the ice if your skin turns bright red. This is very important. If you cannot feel pain, heat, or cold, you have a greater risk of damage to the area.  If directed, apply heat to the painful area as often as told by your health care provider. Heat can reduce the stiffness of your muscles. Use the heat source that your health care provider recommends, such as a moist heat pack or a heating pad. ? Place a towel between your skin and the heat source. ? Leave the heat on for 20-30 minutes. ? Remove the heat if your skin turns bright red. This is especially important if you are unable to feel pain, heat, or cold. You may have a greater risk of getting burned.      General instructions  Change your sitting, standing, and sleeping habits as told by your health care provider.  Avoid sitting in the same position for long periods of time. Change positions frequently.  Lose weight or maintain a healthy weight as told by your health care provider.  Do not use any products that contain nicotine or tobacco, such as cigarettes, e-cigarettes, and chewing tobacco. If you need help quitting, ask your health care provider.  Wear supportive footwear.  Keep all follow-up visits. This is important. This may include visits for physical therapy. Contact a health care provider if you:  Have pain that does not go away within 1-4 weeks.  Lose your appetite.  Lose weight without trying. Get help right away if you:  Have severe pain.  Notice weakness in your arms, hands, or legs.  Begin to lose control of your bladder or bowel movements.  Have fevers or night sweats. Summary  Degenerative disk disease is a condition caused by changes that occur in the spinal disks as a person ages.  This condition can affect the whole spine.  However, the neck and lower back are most often affected.  Take over-the-counter and prescription medicines only as told by your health care provider. This information is not intended to replace advice given to you by your health care provider. Make sure you discuss any questions you have with your health care provider. Document Revised: 10/25/2019 Document Reviewed: 10/25/2019 Elsevier Patient Education  2021 [employer]  

## 2020-11-12 NOTE — Progress Notes (Signed)
   Subjective:    Patient ID: Margart Zemanek, female    DOB: 1934-11-11, 86 y.o.   MRN: [id number]   Chief Complaint:hospital follow up  HPI Patient presents today for follow up. She went to ED on 11/04/20 with flank pain. They did ct of abdomen which showed mild hydronephrosis  With distention of ureter near a surgical clip that was not seen on previous exam. Lumbar spine xray showed progression DDD of lumbar spine. She was given steroid dose pack and roxicodone. She did not take the prednisone. She took one tablet and made her sick so she did not take anymore. The pain meds helped and pain resolved.   Review of Systems  Constitutional: Negative for diaphoresis.  Eyes: Negative for pain.  Respiratory: Negative for shortness of breath.   Cardiovascular: Negative for chest pain, palpitations and leg swelling.  Gastrointestinal: Negative for abdominal pain.  Endocrine: Negative for polydipsia.  Skin: Negative for rash.  Neurological: Negative for dizziness, weakness and headaches.  Hematological: Does not bruise/bleed easily.  All other systems reviewed and are negative.      Objective:   Physical Exam Vitals and nursing note reviewed.  Constitutional:      Appearance: Normal appearance.  Cardiovascular:     Rate and Rhythm: Normal rate and regular rhythm.     Heart sounds: Normal heart sounds.  Pulmonary:     Effort: Pulmonary effort is normal.     Breath sounds: Normal breath sounds.  Musculoskeletal:        General: Normal range of motion.     Comments: (-) SLR bil  Skin:    General: Skin is warm.  Neurological:     General: No focal deficit present.     Mental Status: She is alert and oriented to person, place, and time.  Psychiatric:        Mood and Affect: Mood normal.        Behavior: Behavior normal.    BP (!) 153/84   Pulse (!) 57   Temp (!) 96.9 F (36.1 C) (Temporal)   Resp 20   Ht 5\' 1"  (1.549 m)   Wt 166 lb (75.3 kg)   SpO2 98%   BMI 31.37 kg/m          Assessment & Plan:  Ileen Kahre in today with chief complaint of Hospitalization Follow-up   1. DDD (degenerative disc disease), lumbar Moist heat No heavy lifting  2. Hospital discharge follow-up Hospital records reviewed    The above assessment and management plan was discussed with the patient. The patient verbalized understanding of and has agreed to the management plan. Patient is aware to call the clinic if symptoms persist or worsen. Patient is aware when to return to the clinic for a follow-up visit. Patient educated on when it is appropriate to go to the emergency department.   Mary-Margaret [name], FNP

## 2020-11-20 ENCOUNTER — Other Ambulatory Visit: Payer: Self-pay | Admitting: Nurse Practitioner

## 2020-11-20 DIAGNOSIS — G3184 Mild cognitive impairment, so stated: Secondary | ICD-10-CM | POA: Diagnosis not present

## 2020-11-20 DIAGNOSIS — M542 Cervicalgia: Secondary | ICD-10-CM | POA: Diagnosis not present

## 2020-11-20 DIAGNOSIS — Z1231 Encounter for screening mammogram for malignant neoplasm of breast: Secondary | ICD-10-CM

## 2020-11-20 DIAGNOSIS — G5601 Carpal tunnel syndrome, right upper limb: Secondary | ICD-10-CM | POA: Diagnosis not present

## 2020-11-20 DIAGNOSIS — R202 Paresthesia of skin: Secondary | ICD-10-CM | POA: Diagnosis not present

## 2020-12-17 ENCOUNTER — Ambulatory Visit
Admission: RE | Admit: 2020-12-17 | Discharge: 2020-12-17 | Disposition: A | Discharge status: Home / Self Care | Payer: Medicare HMO | Source: Ambulatory Visit | Attending: Nurse Practitioner | Admitting: Nurse Practitioner

## 2020-12-17 ENCOUNTER — Other Ambulatory Visit: Payer: Self-pay

## 2020-12-17 DIAGNOSIS — Z1231 Encounter for screening mammogram for malignant neoplasm of breast: Secondary | ICD-10-CM

## 2021-01-06 ENCOUNTER — Ambulatory Visit: Payer: Self-pay | Admitting: Nurse Practitioner

## 2021-01-13 ENCOUNTER — Ambulatory Visit (INDEPENDENT_AMBULATORY_CARE_PROVIDER_SITE_OTHER): Payer: Medicare HMO | Admitting: Nurse Practitioner

## 2021-01-13 ENCOUNTER — Encounter: Payer: Self-pay | Admitting: Nurse Practitioner

## 2021-01-13 ENCOUNTER — Other Ambulatory Visit: Payer: Self-pay

## 2021-01-13 VITALS — BP 129/80 | HR 51 | Temp 97.1°F | Resp 20 | Ht 61.0 in | Wt 165.0 lb

## 2021-01-13 DIAGNOSIS — Z6831 Body mass index (BMI) 31.0-31.9, adult: Secondary | ICD-10-CM | POA: Diagnosis not present

## 2021-01-13 DIAGNOSIS — R001 Bradycardia, unspecified: Secondary | ICD-10-CM | POA: Diagnosis not present

## 2021-01-13 DIAGNOSIS — E785 Hyperlipidemia, unspecified: Secondary | ICD-10-CM | POA: Diagnosis not present

## 2021-01-13 DIAGNOSIS — E559 Vitamin D deficiency, unspecified: Secondary | ICD-10-CM | POA: Diagnosis not present

## 2021-01-13 DIAGNOSIS — E042 Nontoxic multinodular goiter: Secondary | ICD-10-CM

## 2021-01-13 DIAGNOSIS — I10 Essential (primary) hypertension: Secondary | ICD-10-CM

## 2021-01-13 DIAGNOSIS — E210 Primary hyperparathyroidism: Secondary | ICD-10-CM

## 2021-01-13 DIAGNOSIS — M8588 Other specified disorders of bone density and structure, other site: Secondary | ICD-10-CM | POA: Diagnosis not present

## 2021-01-13 MED ORDER — SIMVASTATIN 20 MG PO TABS: 20.0000 mg | ORAL_TABLET | Freq: Every day | ORAL | 1 refills | Status: DC

## 2021-01-13 MED ORDER — LISINOPRIL 20 MG PO TABS: 20.0000 mg | ORAL_TABLET | Freq: Every day | ORAL | 1 refills | Status: DC

## 2021-01-13 MED ORDER — AMLODIPINE BESYLATE 2.5 MG PO TABS
2.5000 mg | ORAL_TABLET | Freq: Every day | ORAL | 1 refills | Status: DC
Start: 2021-01-13 — End: 2021-03-05

## 2021-01-13 NOTE — Progress Notes (Signed)
 Subjective:    Patient ID: Mariah Fletcher, female    DOB: 03/14/1935, 85 y.o.   MRN: [id number]   Chief Complaint: Medical Management of Chronic Issues    HPI:  1. Essential hypertension, benign No c/o chest pain, sob or headache. Does not check blood pressure at home. BP Readings from Last 3 Encounters:  01/13/21 129/80  11/12/20 (!) 153/84  09/26/20 138/80     2. Hyperlipidemia with target LDL less than 100 Does not watch diet and does little to no exercise. Lab Results  Component Value Date   CHOL 140 08/19/2020   HDL 71 08/19/2020   LDLCALC 54 08/19/2020   TRIG 74 08/19/2020   CHOLHDL 2.0 08/19/2020     3. Bradycardia No c/o dizziness or syncopial episodes  4. Hyperparathyroidism, primary (HCC) No problems that aware of  5. Multinodular goiter No problems that aware of. Lab Results  Component Value Date   TSH 2.760 08/23/2016     6. Osteopenia of lumbar spine Last dexascan was done on 05/16/18. Her t score was -1.5.  7. Vitamin D deficiency Is on vtamin d and calcium supplement daily  8. BMI 31.0-31.9 No recent weight changes Wt Readings from Last 3 Encounters:  01/13/21 165 lb (74.8 kg)  11/12/20 166 lb (75.3 kg)  09/26/20 170 lb (77.1 kg)   BMI Readings from Last 3 Encounters:  01/13/21 31.18 kg/m  11/12/20 31.37 kg/m  09/26/20 32.12 kg/m     Outpatient Encounter Medications as of 01/13/2021  Medication Sig   amLODipine (NORVASC) 2.5 MG tablet Take 1 tablet (2.5 mg total) by mouth daily.   aspirin 81 MG tablet Take 81 mg by mouth daily.   cholecalciferol (VITAMIN D) 1000 UNITS tablet Take 1 tablet (1,000 Units total) by mouth daily.   lisinopril (ZESTRIL) 20 MG tablet Take 1 tablet (20 mg total) by mouth daily.   simvastatin (ZOCOR) 20 MG tablet Take 1 tablet (20 mg total) by mouth daily at 6 PM. (Needs to be seen before next refill)   No facility-administered encounter medications on file as of 01/13/2021.    Past Surgical History:   Procedure Laterality Date   ABDOMINAL HYSTERECTOMY  07/26/2005   BREAST BIOPSY     unsure of side date or location    Family History  Problem Relation Age of Onset   Hyperparathyroidism Other        neg fam hx   Hypertension Mother    Arthritis Mother    Heart disease Mother    Alcohol abuse Father    Hypertension Sister    Arthritis Sister    Hypertension Brother    Arthritis Brother    Hypertension Sister    Arthritis Sister     New complaints: None today  Social history: Lives by herself- family check son her daily  Controlled substance contract: n/a     Review of Systems  Constitutional:  Negative for diaphoresis.  Eyes:  Negative for pain.  Respiratory:  Negative for shortness of breath.   Cardiovascular:  Negative for chest pain, palpitations and leg swelling.  Gastrointestinal:  Negative for abdominal pain.  Endocrine: Negative for polydipsia.  Skin:  Negative for rash.  Neurological:  Negative for dizziness, weakness and headaches.  Hematological:  Does not bruise/bleed easily.  All other systems reviewed and are negative.     Objective:   Physical Exam Vitals and nursing note reviewed.  Constitutional:      General: She is not in acute distress.      Appearance: Normal appearance. She is well-developed.  HENT:     Head: Normocephalic.     Right Ear: Tympanic membrane normal.     Left Ear: Tympanic membrane normal.     Nose: Nose normal.     Mouth/Throat:     Mouth: Mucous membranes are moist.  Eyes:     Pupils: Pupils are equal, round, and reactive to light.  Neck:     Vascular: No carotid bruit or JVD.  Cardiovascular:     Rate and Rhythm: Normal rate and regular rhythm.     Heart sounds: Normal heart sounds.  Pulmonary:     Effort: Pulmonary effort is normal. No respiratory distress.     Breath sounds: Normal breath sounds. No wheezing or rales.  Chest:     Chest wall: No tenderness.  Abdominal:     General: Bowel sounds are normal.  There is no distension or abdominal bruit.     Palpations: Abdomen is soft. There is no hepatomegaly, splenomegaly, mass or pulsatile mass.     Tenderness: There is no abdominal tenderness.  Musculoskeletal:        General: Normal range of motion.     Cervical back: Normal range of motion and neck supple.  Lymphadenopathy:     Cervical: No cervical adenopathy.  Skin:    General: Skin is warm and dry.     Comments: Small sebaceous cyst ant mid chest wall- 2cm annular  Neurological:     Mental Status: She is alert and oriented to person, place, and time.     Deep Tendon Reflexes: Reflexes are normal and symmetric.  Psychiatric:        Behavior: Behavior normal.        Thought Content: Thought content normal.        Judgment: Judgment normal.      BP 129/80   Pulse (!) 51   Temp (!) 97.1 F (36.2 C) (Temporal)   Resp 20   Ht 5' 1" (1.549 m)   Wt 165 lb (74.8 kg)   SpO2 99%   BMI 31.18 kg/m      Assessment & Plan:  Mariah Fletcher comes in today with chief complaint of Medical Management of Chronic Issues   Diagnosis and orders addressed:  1. Essential hypertension, benign Low sodium diet - lisinopril (ZESTRIL) 20 MG tablet; Take 1 tablet (20 mg total) by mouth daily.  Dispense: 90 tablet; Refill: 1 - amLODipine (NORVASC) 2.5 MG tablet; Take 1 tablet (2.5 mg total) by mouth daily.  Dispense: 90 tablet; Refill: 1 - CBC with Differential/Platelet - CMP14+EGFR  2. Hyperlipidemia with target LDL less than 100 Low fat diet - simvastatin (ZOCOR) 20 MG tablet; Take 1 tablet (20 mg total) by mouth daily at 6 PM. (Needs to be seen before next refill)  Dispense: 90 tablet; Refill: 1 - Lipid panel  3. Bradycardia  4. Hyperparathyroidism, primary (HCC) Labs pending - PTH, Intact and Calcium  5. Multinodular goiter Labs pending - Thyroid Panel With TSH  6. Osteopenia of lumbar spine Weight bearing exercises Will do dexascan at next appointmnet  7. Vitamin D  deficiency Continue dialy vitamin d supplement  8. BMI 31.0-31.9,adult Discussed diet and exercise for person with BMI >25 Will recheck weight in 3-6 months   Labs pending Health Maintenance reviewed Diet and exercise encouraged  Follow up plan: 6 months   Mary-Margaret , FNP   

## 2021-01-14 LAB — CBC WITH DIFFERENTIAL/PLATELET
Basophils Absolute: 0.1 10*3/uL (ref 0.0–0.2)
Basos: 1 %
EOS (ABSOLUTE): 0.1 10*3/uL (ref 0.0–0.4)
Eos: 1 %
Hematocrit: 38.8 % (ref 34.0–46.6)
Hemoglobin: 12.7 g/dL (ref 11.1–15.9)
Immature Grans (Abs): 0 10*3/uL (ref 0.0–0.1)
Immature Granulocytes: 0 %
Lymphocytes Absolute: 2.1 10*3/uL (ref 0.7–3.1)
Lymphs: 38 %
MCH: 29.6 pg (ref 26.6–33.0)
MCHC: 32.7 g/dL (ref 31.5–35.7)
MCV: 90 fL (ref 79–97)
Monocytes Absolute: 0.3 10*3/uL (ref 0.1–0.9)
Monocytes: 6 %
Neutrophils Absolute: 3 10*3/uL (ref 1.4–7.0)
Neutrophils: 54 %
Platelets: 178 10*3/uL (ref 150–450)
RBC: 4.29 x10E6/uL (ref 3.77–5.28)
RDW: 14.1 % (ref 11.7–15.4)
WBC: 5.6 10*3/uL (ref 3.4–10.8)

## 2021-01-14 LAB — LIPID PANEL
Chol/HDL Ratio: 2.3 ratio (ref 0.0–4.4)
Cholesterol, Total: 157 mg/dL (ref 100–199)
HDL: 67 mg/dL (ref >39)
LDL Chol Calc (NIH): 71 mg/dL (ref 0–99)
Triglycerides: 107 mg/dL (ref 0–149)
VLDL Cholesterol Cal: 19 mg/dL (ref 5–40)

## 2021-01-14 LAB — THYROID PANEL WITH TSH
Free Thyroxine Index: 2 (ref 1.2–4.9)
T3 Uptake Ratio: 31 % (ref 24–39)
T4, Total: 6.3 ug/dL (ref 4.5–12.0)
TSH: 1.46 u[IU]/mL (ref 0.450–4.500)

## 2021-01-14 LAB — CMP14+EGFR
ALT: 8 IU/L (ref 0–32)
AST: 14 IU/L (ref 0–40)
Albumin/Globulin Ratio: 1.6 (ref 1.2–2.2)
Albumin: 4.4 g/dL (ref 3.6–4.6)
Alkaline Phosphatase: 60 IU/L (ref 44–121)
BUN/Creatinine Ratio: 21 (ref 12–28)
BUN: 14 mg/dL (ref 8–27)
Bilirubin Total: 1.1 mg/dL (ref 0.0–1.2)
CO2: 21 mmol/L (ref 20–29)
Calcium: 11.1 mg/dL — ABNORMAL HIGH (ref 8.7–10.3)
Chloride: 102 mmol/L (ref 96–106)
Creatinine, Ser: 0.68 mg/dL (ref 0.57–1.00)
Globulin, Total: 2.8 g/dL (ref 1.5–4.5)
Glucose: 74 mg/dL (ref 65–99)
Potassium: 4.7 mmol/L (ref 3.5–5.2)
Sodium: 139 mmol/L (ref 134–144)
Total Protein: 7.2 g/dL (ref 6.0–8.5)
eGFR: 86 mL/min/{1.73_m2} (ref >59)

## 2021-01-14 LAB — PTH, INTACT AND CALCIUM: PTH: 74 pg/mL — ABNORMAL HIGH (ref 15–65)

## 2021-02-12 DIAGNOSIS — G3184 Mild cognitive impairment, so stated: Secondary | ICD-10-CM | POA: Diagnosis not present

## 2021-02-12 DIAGNOSIS — M542 Cervicalgia: Secondary | ICD-10-CM | POA: Diagnosis not present

## 2021-02-12 DIAGNOSIS — G5603 Carpal tunnel syndrome, bilateral upper limbs: Secondary | ICD-10-CM | POA: Diagnosis not present

## 2021-03-05 ENCOUNTER — Other Ambulatory Visit: Payer: Self-pay | Admitting: *Deleted

## 2021-03-05 DIAGNOSIS — I10 Essential (primary) hypertension: Secondary | ICD-10-CM

## 2021-03-05 MED ORDER — LISINOPRIL 20 MG PO TABS: 20.0000 mg | ORAL_TABLET | Freq: Every day | ORAL | 0 refills | Status: DC

## 2021-03-05 MED ORDER — AMLODIPINE BESYLATE 2.5 MG PO TABS: 2.5000 mg | ORAL_TABLET | Freq: Every day | ORAL | 0 refills | Status: DC

## 2021-05-18 DIAGNOSIS — M5412 Radiculopathy, cervical region: Secondary | ICD-10-CM | POA: Diagnosis not present

## 2021-05-18 DIAGNOSIS — G5603 Carpal tunnel syndrome, bilateral upper limbs: Secondary | ICD-10-CM | POA: Diagnosis not present

## 2021-05-18 DIAGNOSIS — G3184 Mild cognitive impairment, so stated: Secondary | ICD-10-CM | POA: Diagnosis not present

## 2021-05-18 DIAGNOSIS — R270 Ataxia, unspecified: Secondary | ICD-10-CM | POA: Diagnosis not present

## 2021-05-18 DIAGNOSIS — G603 Idiopathic progressive neuropathy: Secondary | ICD-10-CM | POA: Diagnosis not present

## 2021-05-22 DIAGNOSIS — H9311 Tinnitus, right ear: Secondary | ICD-10-CM | POA: Insufficient documentation

## 2021-05-22 DIAGNOSIS — H903 Sensorineural hearing loss, bilateral: Secondary | ICD-10-CM | POA: Insufficient documentation

## 2021-05-25 ENCOUNTER — Ambulatory Visit (INDEPENDENT_AMBULATORY_CARE_PROVIDER_SITE_OTHER): Payer: Medicare HMO

## 2021-05-25 VITALS — Ht 61.0 in | Wt 165.0 lb

## 2021-05-25 DIAGNOSIS — M8588 Other specified disorders of bone density and structure, other site: Secondary | ICD-10-CM

## 2021-05-25 DIAGNOSIS — Z0001 Encounter for general adult medical examination with abnormal findings: Secondary | ICD-10-CM | POA: Diagnosis not present

## 2021-05-25 DIAGNOSIS — Z0000 Encounter for general adult medical examination without abnormal findings: Secondary | ICD-10-CM

## 2021-05-25 NOTE — Progress Notes (Signed)
Subjective:   Mariah Fletcher is a 86 y.o. female who presents for Medicare Annual (Subsequent) preventive examination. Virtual Visit via Telephone Note  I connected with  Mariah Fletcher on 05/25/21 at  9:45 AM EDT by telephone and verified that I am speaking with the correct person using two identifiers.  Location: Patient: Home Provider: [facility] Persons participating in the virtual visit: patient/Nurse Health Advisor   I discussed the limitations, risks, security and privacy concerns of performing an evaluation and management service by telephone and the availability of in person appointments. The patient expressed understanding and agreed to proceed.  Interactive audio and video telecommunications were attempted between this nurse and patient, however failed, due to patient having technical difficulties OR patient did not have access to video capability.  We continued and completed visit with audio only.  Some vital signs may be absent or patient reported.   Mariah Dash, LPN  Review of Systems     Cardiac Risk Factors include: advanced age (>66men, >46 women);hypertension;obesity (BMI >30kg/m2);sedentary lifestyle     Objective:    Today's Vitals   05/25/21 0941  Weight: 165 lb (74.8 kg)  Height: 5\' 1"  (1.549 m)   Body mass index is 31.18 kg/m.  Advanced Directives 05/25/2021 07/12/2019 06/06/2018 02/24/2015  Does Patient Have a Medical Advance Directive? No Yes Yes No  Type of Advance Directive - Living will Living will;Healthcare Power of Attorney -  Does patient want to make changes to medical advance directive? - No - Patient declined No - Patient declined -  Copy of Healthcare Power of Attorney in Chart? - - No - copy requested -  Would patient like information on creating a medical advance directive? No - Patient declined - - Yes - Educational materials given    Current Medications (verified) Outpatient Encounter Medications as of 05/25/2021  Medication Sig    amLODipine (NORVASC) 2.5 MG tablet Take 1 tablet (2.5 mg total) by mouth daily.   aspirin 81 MG tablet Take 81 mg by mouth daily.   cholecalciferol (VITAMIN D) 1000 UNITS tablet Take 1 tablet (1,000 Units total) by mouth daily.   lisinopril (ZESTRIL) 20 MG tablet Take 1 tablet (20 mg total) by mouth daily.   SHINGRIX injection    simvastatin (ZOCOR) 20 MG tablet Take 1 tablet (20 mg total) by mouth daily at 6 PM. (Needs to be seen before next refill)   No facility-administered encounter medications on file as of 05/25/2021.    Allergies (verified) Patient has no known allergies.   History: Past Medical History:  Diagnosis Date   Allergy    Hyperlipidemia    Hypertension    Past Surgical History:  Procedure Laterality Date   ABDOMINAL HYSTERECTOMY  07/26/2005   BREAST BIOPSY     unsure of side date or location   Family History  Problem Relation Age of Onset   Hyperparathyroidism Other        neg fam hx   Hypertension Mother    Arthritis Mother    Heart disease Mother    Alcohol abuse Father    Hypertension Sister    Arthritis Sister    Hypertension Brother    Arthritis Brother    Hypertension Sister    Arthritis Sister    Social History   Socioeconomic History   Marital status: Single    Spouse name: Not on file   Number of children: 2   Years of education: 12   Highest education level: Not on  file  Occupational History   Occupation: retired  Tobacco Use   Smoking status: Never   Smokeless tobacco: Never  Vaping Use   Vaping Use: Never used  Substance and Sexual Activity   Alcohol use: Yes    Comment: Rarely drinks wine    Drug use: Never   Sexual activity: Not on file  Other Topics Concern   Not on file  Social History Narrative   2 sons   4 grandchildren   Social Determinants of Health   Financial Resource Strain: Low Risk    Difficulty of Paying Living Expenses: Not hard at all  Food Insecurity: No Food Insecurity   Worried About Patent examiner in the Last Year: Never true   Barista in the Last Year: Never true  Transportation Needs: No Transportation Needs   Lack of Transportation (Medical): No   Lack of Transportation (Non-Medical): No  Physical Activity: Insufficiently Active   Days of Exercise per Week: 3 days   Minutes of Exercise per Session: 20 min  Stress: No Stress Concern Present   Feeling of Stress : Not at all  Social Connections: Moderately Integrated   Frequency of Communication with Friends and Family: More than three times a week   Frequency of Social Gatherings with Friends and Family: More than three times a week   Attends Religious Services: More than 4 times per year   Active Member of [employer] or Organizations: Yes   Attends Engineer, structural: More than 4 times per year   Marital Status: Never married    Tobacco Counseling Counseling given: Not Answered   Clinical Intake:  Pre-visit preparation completed: Yes  Pain : No/denies pain     BMI - recorded: 31.18 Nutritional Status: BMI > 30  Obese Nutritional Risks: None Diabetes: No  How often do you need to have someone help you when you read instructions, pamphlets, or other written materials from your doctor or pharmacy?: 1 - Never  Diabetic?no  Interpreter Needed?: No  Information entered by :: MJ Olga Bourbeau, LPN   Activities of Daily Living In your present state of health, do you have any difficulty performing the following activities: 05/25/2021 11/12/2020  Hearing? N N  Vision? N N  Difficulty concentrating or making decisions? Y N  Comment Sometimes remember. -  Walking or climbing stairs? N N  Dressing or bathing? N N  Doing errands, shopping? N N  Preparing Food and eating ? N -  Using the Toilet? N -  In the past six months, have you accidently leaked urine? Y -  Comment Urgency. -  Do you have problems with loss of bowel control? N -  Managing your Medications? N -  Managing your Finances? N -   Housekeeping or managing your Housekeeping? N -  Some recent data might be hidden    Patient Care Team: Bennie Pierini, FNP as PCP - General (Nurse Practitioner) Rollene Rotunda, MD as PCP - Cardiology (Cardiology)  Indicate any recent Medical Services you may have received from other than [facility] providers in the past year (date may be approximate).     Assessment:   This is a routine wellness examination for Emila.  Hearing/Vision screen Hearing Screening - Comments:: No hearing issues.   Vision Screening - Comments:: Readers. 2021.   Dietary issues and exercise activities discussed: Current Exercise Habits: Home exercise routine, Type of exercise: Other - see comments (Pedal bike), Time (Minutes): 20, Frequency (Times/Week): 3, Weekly  Exercise (Minutes/Week): 60, Intensity: Mild, Exercise limited by: cardiac condition(s);orthopedic condition(s)   Goals Addressed             This Visit's Progress    DIET - REDUCE SUGAR INTAKE   On track    Reduce intake of baked goods.        Depression Screen PHQ 2/9 Scores 05/25/2021 01/13/2021 11/12/2020 09/26/2020 08/19/2020 04/21/2020 09/17/2019  PHQ - 2 Score 0 0 0 0 0 0 0  PHQ- 9 Score - 0 - - - - -    Fall Risk Fall Risk  05/25/2021 01/13/2021 11/12/2020 09/26/2020 08/19/2020  Falls in the past year? 0 0 0 0 0  Number falls in past yr: 0 - - - -  Injury with Fall? 0 - - - -  Risk for fall due to : Impaired balance/gait - - - -  Follow up Falls evaluation completed;Falls prevention discussed - - - -    FALL RISK PREVENTION PERTAINING TO THE HOME:  Any stairs in or around the home? Yes  If so, are there any without handrails? No  Home free of loose throw rugs in walkways, pet beds, electrical cords, etc? Yes  Adequate lighting in your home to reduce risk of falls? Yes   ASSISTIVE DEVICES UTILIZED TO PREVENT FALLS:  Life alert? No  Use of a cane, walker or w/c? No  Grab bars in the bathroom? Yes  Shower chair or bench in  shower? Yes  Elevated toilet seat or a handicapped toilet? Yes   TIMED UP AND GO:  Was the test performed? No . Phone visit.   Cognitive Function: MMSE - Mini Mental State Exam 06/07/2018  Orientation to time 5  Orientation to Place 5  Registration 3  Attention/ Calculation 5  Recall 2  Language- name 2 objects 2  Language- repeat 1  Language- follow 3 step command 3  Language- read & follow direction 1  Write a sentence 1  Copy design 1  Total score 29     6CIT Screen 05/25/2021 07/12/2019  What Year? 0 points 0 points  What month? 0 points 0 points  What time? 0 points 0 points  Count back from 20 0 points 0 points  Months in reverse 0 points 0 points  Repeat phrase 0 points 2 points  Total Score 0 2    Immunizations Immunization History  Administered Date(s) Administered   Fluad Quad(high Dose 65+) 04/21/2020   Influenza Split 05/16/2013   Influenza, High Dose Seasonal PF 05/20/2014, 05/10/2015, 05/01/2018, 05/01/2019, 05/01/2019, 05/05/2021   Influenza,inj,Quad PF,6+ Mos 04/30/2016   Influenza-Unspecified 04/25/2017   Moderna Sars-Covid-2 Vaccination 10/05/2019, 11/02/2019   Pneumococcal Conjugate-13 11/18/2014   Pneumococcal Polysaccharide-23 06/06/2018    TDAP status: Due, Education has been provided regarding the importance of this vaccine. Advised may receive this vaccine at local pharmacy or Health Dept. Aware to provide a copy of the vaccination record if obtained from local pharmacy or Health Dept. Verbalized acceptance and understanding.  Flu Vaccine status: Up to date  Pneumococcal vaccine status: Up to date  Covid-19 vaccine status: Information provided on how to obtain vaccines.   Qualifies for Shingles Vaccine? Yes   Zostavax completed No   Shingrix Completed?: No.    Education has been provided regarding the importance of this vaccine. Patient has been advised to call insurance company to determine out of pocket expense if they have not yet  received this vaccine. Advised may also receive vaccine at local pharmacy or  Health Dept. Verbalized acceptance and understanding.  Screening Tests Health Maintenance  Topic Date Due   Zoster Vaccines- Shingrix (1 of 2) Never done   COVID-19 Vaccine (3 - Moderna risk series) 11/30/2019   TETANUS/TDAP  08/19/2021 (Originally 04/25/2020)   Pneumonia Vaccine 50+ Years old  Completed   INFLUENZA VACCINE  Completed   DEXA SCAN  Completed   HPV VACCINES  Aged Out    Health Maintenance  Health Maintenance Due  Topic Date Due   Zoster Vaccines- Shingrix (1 of 2) Never done   COVID-19 Vaccine (3 - Moderna risk series) 11/30/2019    Colorectal cancer screening: No longer required.   Mammogram status: No longer required due to AGE.  Bone Density status: Ordered 05/25/2021. Pt provided with contact info and advised to call to schedule appt.  Lung Cancer Screening: (Low Dose CT Chest recommended if Age 68-80 years, 30 pack-year currently smoking OR have quit w/in 15years.) does not qualify.     Additional Screening:  Hepatitis C Screening: does not qualify.  Vision Screening: Recommended annual ophthalmology exams for early detection of glaucoma and other disorders of the eye. Is the patient up to date with their annual eye exam?  No  Who is the provider or what is the name of the office in which the patient attends annual eye exams? [facility] If pt is not established with a provider, would they like to be referred to a provider to establish care? No .   Dental Screening: Recommended annual dental exams for proper oral hygiene  Community Resource Referral / Chronic Care Management: CRR required this visit?  No   CCM required this visit?  No      Plan:     I have personally reviewed and noted the following in the patient's chart:   Medical and social history Use of alcohol, tobacco or illicit drugs  Current medications and supplements including opioid prescriptions.   Functional ability and status Nutritional status Physical activity Advanced directives List of other physicians Hospitalizations, surgeries, and ER visits in previous 12 months Vitals Screenings to include cognitive, depression, and falls Referrals and appointments  In addition, I have reviewed and discussed with patient certain preventive protocols, quality metrics, and best practice recommendations. A written personalized care plan for preventive services as well as general preventive health recommendations were provided to patient.     Mariah Dash, LPN   02/72/[id number]   Nurse Notes: Pt states she is doing well. Discussed Covid booster and shingles vaccines. Bone density ordered today. Colonoscopy and Mammogram no longer required due to age.

## 2021-05-25 NOTE — Patient Instructions (Signed)
Mariah Fletcher , Thank you for taking time to come for your Medicare Wellness Visit. I appreciate your ongoing commitment to your health goals. Please review the following plan we discussed and let me know if I can assist you in the future.   Screening recommendations/referrals: Colonoscopy: Done 06/03/2016. No longer required.  Mammogram: Done 12/17/20. Repeat annually  Bone Density: Done 05/16/2018. Repeat every 2 years Order placed today.  Recommended yearly ophthalmology/optometry visit for glaucoma screening and checkup Recommended yearly dental visit for hygiene and checkup  Vaccinations: Influenza vaccine: Done 05/05/2021. Pneumococcal vaccine: Done 11/18/2014 and 06/06/2018 Tdap vaccine: Due. Repeat in 10 years  Shingles vaccine: Shingrix discussed. Please contact your pharmacy for coverage information.     Covid-19:Done 10/05/2019 and 11/02/2019  Advanced directives: Advance directive discussed with you today. Even though you declined this today, please call our office should you change your mind, and we can give you the proper paperwork for you to fill out.   Conditions/risks identified: Aim for 30 minutes of exercise or brisk walking each day, drink 6-8 glasses of water and eat lots of fruits and vegetables.   Next appointment: Follow up in one year for your annual wellness visit 2023.   Preventive Care 75 Years and Older, Female Preventive care refers to lifestyle choices and visits with your health care provider that can promote health and wellness. What does preventive care include? A yearly physical exam. This is also called an annual well check. Dental exams once or twice a year. Routine eye exams. Ask your health care provider how often you should have your eyes checked. Personal lifestyle choices, including: Daily care of your teeth and gums. Regular physical activity. Eating a healthy diet. Avoiding tobacco and drug use. Limiting alcohol use. Practicing safe  sex. Taking low-dose aspirin every day. Taking vitamin and mineral supplements as recommended by your health care provider. What happens during an annual well check? The services and screenings done by your health care provider during your annual well check will depend on your age, overall health, lifestyle risk factors, and family history of disease. Counseling  Your health care provider may ask you questions about your: Alcohol use. Tobacco use. Drug use. Emotional well-being. Home and relationship well-being. Sexual activity. Eating habits. History of falls. Memory and ability to understand (cognition). Work and work Astronomer. Reproductive health. Screening  You may have the following tests or measurements: Height, weight, and BMI. Blood pressure. Lipid and cholesterol levels. These may be checked every 5 years, or more frequently if you are over 73 years old. Skin check. Lung cancer screening. You may have this screening every year starting at age 39 if you have a 30-pack-year history of smoking and currently smoke or have quit within the past 15 years. Fecal occult blood test (FOBT) of the stool. You may have this test every year starting at age 93. Flexible sigmoidoscopy or colonoscopy. You may have a sigmoidoscopy every 5 years or a colonoscopy every 10 years starting at age 92. Hepatitis C blood test. Hepatitis B blood test. Sexually transmitted disease (STD) testing. Diabetes screening. This is done by checking your blood sugar (glucose) after you have not eaten for a while (fasting). You may have this done every 1-3 years. Bone density scan. This is done to screen for osteoporosis. You may have this done starting at age 25. Mammogram. This may be done every 1-2 years. Talk to your health care provider about how often you should have regular mammograms. Talk with your  health care provider about your test results, treatment options, and if necessary, the need for more  tests. Vaccines  Your health care provider may recommend certain vaccines, such as: Influenza vaccine. This is recommended every year. Tetanus, diphtheria, and acellular pertussis (Tdap, Td) vaccine. You may need a Td booster every 10 years. Zoster vaccine. You may need this after age 75. Pneumococcal 13-valent conjugate (PCV13) vaccine. One dose is recommended after age 13. Pneumococcal polysaccharide (PPSV23) vaccine. One dose is recommended after age 77. Talk to your health care provider about which screenings and vaccines you need and how often you need them. This information is not intended to replace advice given to you by your health care provider. Make sure you discuss any questions you have with your health care provider. Document Released: 08/08/2015 Document Revised: 03/31/2016 Document Reviewed: 05/13/2015 Elsevier Interactive Patient Education  2017 [employer] Prevention in the Home Falls can cause injuries. They can happen to people of all ages. There are many things you can do to make your home safe and to help prevent falls. What can I do on the outside of my home? Regularly fix the edges of walkways and driveways and fix any cracks. Remove anything that might make you trip as you walk through a door, such as a raised step or threshold. Trim any bushes or trees on the path to your home. Use bright outdoor lighting. Clear any walking paths of anything that might make someone trip, such as rocks or tools. Regularly check to see if handrails are loose or broken. Make sure that both sides of any steps have handrails. Any raised decks and porches should have guardrails on the edges. Have any leaves, snow, or ice cleared regularly. Use sand or salt on walking paths during winter. Clean up any spills in your garage right away. This includes oil or grease spills. What can I do in the bathroom? Use night lights. Install grab bars by the toilet and in the tub and shower.  Do not use towel bars as grab bars. Use non-skid mats or decals in the tub or shower. If you need to sit down in the shower, use a plastic, non-slip stool. Keep the floor dry. Clean up any water that spills on the floor as soon as it happens. Remove soap buildup in the tub or shower regularly. Attach bath mats securely with double-sided non-slip rug tape. Do not have throw rugs and other things on the floor that can make you trip. What can I do in the bedroom? Use night lights. Make sure that you have a light by your bed that is easy to reach. Do not use any sheets or blankets that are too big for your bed. They should not hang down onto the floor. Have a firm chair that has side arms. You can use this for support while you get dressed. Do not have throw rugs and other things on the floor that can make you trip. What can I do in the kitchen? Clean up any spills right away. Avoid walking on wet floors. Keep items that you use a lot in easy-to-reach places. If you need to reach something above you, use a strong step stool that has a grab bar. Keep electrical cords out of the way. Do not use floor polish or wax that makes floors slippery. If you must use wax, use non-skid floor wax. Do not have throw rugs and other things on the floor that can make you trip. What  can I do with my stairs? Do not leave any items on the stairs. Make sure that there are handrails on both sides of the stairs and use them. Fix handrails that are broken or loose. Make sure that handrails are as long as the stairways. Check any carpeting to make sure that it is firmly attached to the stairs. Fix any carpet that is loose or worn. Avoid having throw rugs at the top or bottom of the stairs. If you do have throw rugs, attach them to the floor with carpet tape. Make sure that you have a light switch at the top of the stairs and the bottom of the stairs. If you do not have them, ask someone to add them for you. What else  can I do to help prevent falls? Wear shoes that: Do not have high heels. Have rubber bottoms. Are comfortable and fit you well. Are closed at the toe. Do not wear sandals. If you use a stepladder: Make sure that it is fully opened. Do not climb a closed stepladder. Make sure that both sides of the stepladder are locked into place. Ask someone to hold it for you, if possible. Clearly mark and make sure that you can see: Any grab bars or handrails. First and last steps. Where the edge of each step is. Use tools that help you move around (mobility aids) if they are needed. These include: Canes. Walkers. Scooters. Crutches. Turn on the lights when you go into a dark area. Replace any light bulbs as soon as they burn out. Set up your furniture so you have a clear path. Avoid moving your furniture around. If any of your floors are uneven, fix them. If there are any pets around you, be aware of where they are. Review your medicines with your doctor. Some medicines can make you feel dizzy. This can increase your chance of falling. Ask your doctor what other things that you can do to help prevent falls. This information is not intended to replace advice given to you by your health care provider. Make sure you discuss any questions you have with your health care provider. Document Released: 05/08/2009 Document Revised: 12/18/2015 Document Reviewed: 08/16/2014 Elsevier Interactive Patient Education  2017 [employer].

## 2021-06-03 ENCOUNTER — Other Ambulatory Visit: Payer: Self-pay | Admitting: Nurse Practitioner

## 2021-06-03 DIAGNOSIS — I10 Essential (primary) hypertension: Secondary | ICD-10-CM

## 2021-07-16 ENCOUNTER — Ambulatory Visit (INDEPENDENT_AMBULATORY_CARE_PROVIDER_SITE_OTHER): Payer: Medicare HMO | Admitting: Nurse Practitioner

## 2021-07-16 ENCOUNTER — Encounter: Payer: Self-pay | Admitting: Nurse Practitioner

## 2021-07-16 ENCOUNTER — Ambulatory Visit (INDEPENDENT_AMBULATORY_CARE_PROVIDER_SITE_OTHER): Payer: Medicare HMO

## 2021-07-16 VITALS — BP 136/82 | HR 50 | Temp 97.8°F | Resp 20 | Ht 61.0 in | Wt 164.0 lb

## 2021-07-16 DIAGNOSIS — I10 Essential (primary) hypertension: Secondary | ICD-10-CM | POA: Diagnosis not present

## 2021-07-16 DIAGNOSIS — Z780 Asymptomatic menopausal state: Secondary | ICD-10-CM | POA: Diagnosis not present

## 2021-07-16 DIAGNOSIS — E042 Nontoxic multinodular goiter: Secondary | ICD-10-CM

## 2021-07-16 DIAGNOSIS — M8588 Other specified disorders of bone density and structure, other site: Secondary | ICD-10-CM | POA: Diagnosis not present

## 2021-07-16 DIAGNOSIS — E559 Vitamin D deficiency, unspecified: Secondary | ICD-10-CM

## 2021-07-16 DIAGNOSIS — Z6833 Body mass index (BMI) 33.0-33.9, adult: Secondary | ICD-10-CM | POA: Diagnosis not present

## 2021-07-16 DIAGNOSIS — E785 Hyperlipidemia, unspecified: Secondary | ICD-10-CM

## 2021-07-16 DIAGNOSIS — E210 Primary hyperparathyroidism: Secondary | ICD-10-CM

## 2021-07-16 DIAGNOSIS — M8589 Other specified disorders of bone density and structure, multiple sites: Secondary | ICD-10-CM | POA: Diagnosis not present

## 2021-07-16 MED ORDER — AMLODIPINE BESYLATE 2.5 MG PO TABS
2.5000 mg | ORAL_TABLET | Freq: Every day | ORAL | 1 refills | Status: DC
Start: 2021-07-16 — End: 2022-04-20

## 2021-07-16 MED ORDER — LISINOPRIL 20 MG PO TABS
20.0000 mg | ORAL_TABLET | Freq: Every day | ORAL | 1 refills | Status: DC
Start: 2021-07-16 — End: 2022-04-06

## 2021-07-16 MED ORDER — SIMVASTATIN 20 MG PO TABS: 20.0000 mg | ORAL_TABLET | Freq: Every day | ORAL | 1 refills | Status: DC

## 2021-07-16 NOTE — Progress Notes (Signed)
Subjective:    Patient ID: Mariah Fletcher, female    DOB: 1934-07-28, 86 y.o.   MRN: [id number]   Chief Complaint: Medical Management of Chronic Issues    HPI:  Mariah Fletcher is a 86 y.o. who identifies as a female who was assigned female at birth.   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 Essential hypertension, benign No c/o chest pain, sob or headache. Does not check blood pressure at home. BP Readings from Last 3 Encounters:  01/13/21 129/80  11/12/20 (!) 153/84  09/26/20 138/80     2. Hyperlipidemia with target LDL less than 100 Does not really watch diet and does no dedicated exercise. Lab Results  Component Value Date   CHOL 157 01/13/2021   HDL 67 01/13/2021   LDLCALC 71 01/13/2021   TRIG 107 01/13/2021   CHOLHDL 2.3 01/13/2021  The ASCVD Risk score (Arnett DK, et al., 2019) failed to calculate for the following reasons:   The 2019 ASCVD risk score is only valid for ages 8 to 31    3. Multinodular goiter No problems that she is aware of. Lab Results  Component Value Date   TSH 1.460 01/13/2021     4. Hyperparathyroidism, primary ([place]) No problems that aware of. Lab Results  Component Value Date   PTH 74 (H) 01/13/2021   PTH Comment 01/13/2021   CALCIUM 11.1 (H) 01/13/2021     5. Vitamin D deficiency Daily vitamin d supplement  6. Osteopenia of lumbar spine Last dexascan was done today- results are not back yet  7. BMI 33.0-33.9,adult No recent weight changes Wt Readings from Last 3 Encounters:  07/16/21 164 lb (74.4 kg)  05/25/21 165 lb (74.8 kg)  01/13/21 165 lb (74.8 kg)   BMI Readings from Last 3 Encounters:  07/16/21 30.99 kg/m  05/25/21 31.18 kg/m  01/13/21 31.18 kg/m      New complaints: None today  No Known Allergies Outpatient Encounter Medications as of 07/16/2021   Medication Sig   amLODipine (NORVASC) 2.5 MG tablet TAKE 1 TABLET EVERY DAY   aspirin 81 MG tablet Take 81 mg by mouth daily.   cholecalciferol (VITAMIN D) 1000 UNITS tablet Take 1 tablet (1,000 Units total) by mouth daily.   lisinopril (ZESTRIL) 20 MG tablet Take 1 tablet (20 mg total) by mouth daily.   SHINGRIX injection    simvastatin (ZOCOR) 20 MG tablet Take 1 tablet (20 mg total) by mouth daily at 6 PM. (Needs to be seen before next refill)   No facility-administered encounter medications on file as of 07/16/2021.    Past Surgical History:  Procedure Laterality Date   ABDOMINAL HYSTERECTOMY  07/26/2005   BREAST BIOPSY     unsure of side date or location    Family History  Problem Relation Age of Onset   Hyperparathyroidism Other        neg fam hx   Hypertension Mother    Arthritis Mother    Heart disease Mother    Alcohol abuse Father    Hypertension Sister    Arthritis Sister    Hypertension Brother    Arthritis Brother    Hypertension Sister    Arthritis Sister       Controlled substance contract: n/a     Review of Systems  Constitutional:  Negative for diaphoresis.  Eyes:  Negative for pain.  Respiratory:  Negative for shortness of breath.   Cardiovascular:  Negative for chest pain, palpitations and leg swelling.  Gastrointestinal:  Negative for abdominal pain.  Endocrine: Negative for polydipsia.  Skin:  Negative for rash.  Neurological:  Negative for dizziness, weakness and headaches.  Hematological:  Does not bruise/bleed easily.  All other systems reviewed and are negative.     Objective:   Physical Exam Vitals and nursing note reviewed.  Constitutional:      General: She is not in acute distress.    Appearance: Normal appearance. She is well-developed.  HENT:     Head: Normocephalic.     Right Ear: Tympanic membrane normal.     Left Ear: Tympanic membrane normal.     Nose: Nose normal.     Mouth/Throat:     Mouth: Mucous membranes are  moist.  Eyes:     Pupils: Pupils are equal, round, and reactive to light.  Neck:     Vascular: No carotid bruit or JVD.  Cardiovascular:     Rate and Rhythm: Normal rate and regular rhythm.     Heart sounds: Normal heart sounds.  Pulmonary:     Effort: Pulmonary effort is normal. No respiratory distress.     Breath sounds: Normal breath sounds. No wheezing or rales.  Chest:     Chest wall: No tenderness.  Abdominal:     General: Bowel sounds are normal. There is no distension or abdominal bruit.     Palpations: Abdomen is soft. There is no hepatomegaly, splenomegaly, mass or pulsatile mass.     Tenderness: There is no abdominal tenderness.  Musculoskeletal:        General: Normal range of motion.     Cervical back: Normal range of motion and neck supple.  Lymphadenopathy:     Cervical: No cervical adenopathy.  Skin:    General: Skin is warm and dry.  Neurological:     Mental Status: She is alert and oriented to person, place, and time.     Deep Tendon Reflexes: Reflexes are normal and symmetric.  Psychiatric:        Behavior: Behavior normal.        Thought Content: Thought content normal.        Judgment: Judgment normal.    BP 136/82 (BP Location: Right Arm, Cuff Size: Normal)    Pulse (!) 50    Temp 97.8 F (36.6 C) (Temporal)    Resp 20    Ht '5\' 1"'  (1.549 m)    Wt 164 lb (74.4 kg)    SpO2 99%    BMI 30.99 kg/m         Assessment & Plan:   Mariah Fletcher comes in today with chief complaint of Medical Management of Chronic Issues   Diagnosis and orders addressed:  1. Essential hypertension, benign Low sodium diet - CBC with Differential/Platelet - CMP14+EGFR - amLODipine (NORVASC) 2.5 MG tablet; Take 1 tablet (2.5 mg total) by mouth daily.  Dispense: 90 tablet; Refill: 1 - lisinopril (ZESTRIL) 20 MG tablet; Take 1 tablet (20 mg total) by mouth daily.  Dispense: 90 tablet; Refill: 1  2. Hyperlipidemia with target LDL less than 100 Low fat diet - Lipid panel -  simvastatin (ZOCOR) 20 MG tablet; Take 1 tablet (20 mg total) by mouth daily at 6 PM. (Needs to be seen before next refill)  Dispense: 90 tablet; Refill: 1  3. Multinodular goiter Labs pending - Thyroid Panel  With TSH  4. Hyperparathyroidism, primary ([place]) Labs pending - PTH, Intact and Calcium  5. Vitamin D deficiency Continue daily vitamin d supplement  6. Osteopenia of lumbar spine Weight bearig exercises sneocuraged Will call with dexascan results  7. BMI 33.0-33.9,adult Discussed diet and exercise for person with BMI >25 Will recheck weight in 3-6 months    Labs pending Health Maintenance reviewed Diet and exercise encouraged  Follow up plan: 6 months   Mary-Margaret Hassell Done, FNP

## 2021-07-16 NOTE — Patient Instructions (Signed)

## 2021-07-17 LAB — CMP14+EGFR
ALT: 12 IU/L (ref 0–32)
AST: 15 IU/L (ref 0–40)
Albumin/Globulin Ratio: 1.4 (ref 1.2–2.2)
Albumin: 4.2 g/dL (ref 3.6–4.6)
Alkaline Phosphatase: 58 IU/L (ref 44–121)
BUN/Creatinine Ratio: 18 (ref 12–28)
BUN: 12 mg/dL (ref 8–27)
Bilirubin Total: 1.1 mg/dL (ref 0.0–1.2)
CO2: 25 mmol/L (ref 20–29)
Calcium: 11.4 mg/dL — ABNORMAL HIGH (ref 8.7–10.3)
Chloride: 104 mmol/L (ref 96–106)
Creatinine, Ser: 0.68 mg/dL (ref 0.57–1.00)
Globulin, Total: 3 g/dL (ref 1.5–4.5)
Glucose: 76 mg/dL (ref 70–99)
Potassium: 4.5 mmol/L (ref 3.5–5.2)
Sodium: 140 mmol/L (ref 134–144)
Total Protein: 7.2 g/dL (ref 6.0–8.5)
eGFR: 86 mL/min/{1.73_m2} (ref >59)

## 2021-07-17 LAB — CBC WITH DIFFERENTIAL/PLATELET
Basophils Absolute: 0.1 10*3/uL (ref 0.0–0.2)
Basos: 1 %
EOS (ABSOLUTE): 0.1 10*3/uL (ref 0.0–0.4)
Eos: 2 %
Hematocrit: 38.1 % (ref 34.0–46.6)
Hemoglobin: 12.8 g/dL (ref 11.1–15.9)
Immature Grans (Abs): 0 10*3/uL (ref 0.0–0.1)
Immature Granulocytes: 0 %
Lymphocytes Absolute: 2.1 10*3/uL (ref 0.7–3.1)
Lymphs: 39 %
MCH: 29.8 pg (ref 26.6–33.0)
MCHC: 33.6 g/dL (ref 31.5–35.7)
MCV: 89 fL (ref 79–97)
Monocytes Absolute: 0.4 10*3/uL (ref 0.1–0.9)
Monocytes: 7 %
Neutrophils Absolute: 2.7 10*3/uL (ref 1.4–7.0)
Neutrophils: 51 %
Platelets: 203 10*3/uL (ref 150–450)
RBC: 4.3 x10E6/uL (ref 3.77–5.28)
RDW: 13.1 % (ref 11.7–15.4)
WBC: 5.4 10*3/uL (ref 3.4–10.8)

## 2021-07-17 LAB — LIPID PANEL
Chol/HDL Ratio: 2.4 ratio (ref 0.0–4.4)
Cholesterol, Total: 159 mg/dL (ref 100–199)
HDL: 65 mg/dL (ref >39)
LDL Chol Calc (NIH): 76 mg/dL (ref 0–99)
Triglycerides: 98 mg/dL (ref 0–149)
VLDL Cholesterol Cal: 18 mg/dL (ref 5–40)

## 2021-07-17 LAB — THYROID PANEL WITH TSH
Free Thyroxine Index: 1.6 (ref 1.2–4.9)
T3 Uptake Ratio: 28 % (ref 24–39)
T4, Total: 5.8 ug/dL (ref 4.5–12.0)
TSH: 1.63 u[IU]/mL (ref 0.450–4.500)

## 2021-07-17 LAB — PTH, INTACT AND CALCIUM: PTH: 65 pg/mL (ref 15–65)

## 2022-01-18 ENCOUNTER — Ambulatory Visit (INDEPENDENT_AMBULATORY_CARE_PROVIDER_SITE_OTHER): Payer: Medicare HMO | Admitting: Nurse Practitioner

## 2022-01-18 ENCOUNTER — Telehealth: Payer: Self-pay | Admitting: Cardiology

## 2022-01-18 ENCOUNTER — Encounter: Payer: Self-pay | Admitting: Nurse Practitioner

## 2022-01-18 VITALS — BP 169/81 | HR 44 | Temp 97.5°F | Resp 20 | Ht 61.0 in | Wt 165.0 lb

## 2022-01-18 DIAGNOSIS — Z6833 Body mass index (BMI) 33.0-33.9, adult: Secondary | ICD-10-CM

## 2022-01-18 DIAGNOSIS — E785 Hyperlipidemia, unspecified: Secondary | ICD-10-CM

## 2022-01-18 DIAGNOSIS — E210 Primary hyperparathyroidism: Secondary | ICD-10-CM

## 2022-01-18 DIAGNOSIS — E559 Vitamin D deficiency, unspecified: Secondary | ICD-10-CM

## 2022-01-18 DIAGNOSIS — R001 Bradycardia, unspecified: Secondary | ICD-10-CM | POA: Diagnosis not present

## 2022-01-18 DIAGNOSIS — M8588 Other specified disorders of bone density and structure, other site: Secondary | ICD-10-CM

## 2022-01-18 NOTE — Telephone Encounter (Signed)
STAT if HR is under 50 or over 120 (normal HR is 60-100 beats per minute)  What is your heart rate?  40  Do you have a log of your heart rate readings (document readings)?   Do you have any other symptoms?   Courtney with [facility] called in and scheduled the patient for 6/27 with Dr. Antoine Poche due to low HR. She declined speaking with clinical staff to further discuss. States she will talk to her provider to determine whether peer-to peer is needed. Per Toni Amend, patient is asymptomatic and a call may be returned to the patient with any advisement.

## 2022-01-18 NOTE — Progress Notes (Signed)
Subjective:    Patient ID: Mariah Fletcher, female    DOB: 12-May-1935, 87 y.o.   MRN: [id number]    Chief Complaint: Medical Management of Chronic Issues    HPI:  Mariah Fletcher is a 87 y.o. who identifies as a female who was assigned female at birth.   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 Hyperparathyroidism, primary ([facility]) No problmes that she is aware of. Lab Results  Component Value Date   PTH 65 07/16/2021   PTH Comment 07/16/2021   CALCIUM 11.4 (H) 07/16/2021     2. Hyperlipidemia with target LDL less than 100 Does try to watch diet but does no dedicated exercise. Lab Results  Component Value Date   CHOL 159 07/16/2021   HDL 65 07/16/2021   LDLCALC 76 07/16/2021   TRIG 98 07/16/2021   CHOLHDL 2.4 07/16/2021     3. Osteopenia of lumbar spine Does no weight bearing exercises. Last dexascan was done on 07/16/21. T score was *1.8  4. Bradycardia Has ha history of low heart rate. She denies any syncopal or near syncopal episodes. Pulse Readings from Last 3 Encounters:  01/18/22 (!) 44  07/16/21 (!) 50  01/13/21 (!) 51    5. Vitamin D deficiency Is on daily vitamin d supplement  6. BMI 33.0-33.9,adult No recent weight changes Wt Readings from Last 3 Encounters:  01/18/22 165 lb (74.8 kg)  07/16/21 164 lb (74.4 kg)  05/25/21 165 lb (74.8 kg)   BMI Readings from Last 3 Encounters:  01/18/22 31.18 kg/m  07/16/21 30.99 kg/m  05/25/21 31.18 kg/m      New complaints: None today  No Known Allergies Outpatient Encounter Medications as of 01/18/2022  Medication Sig   amLODipine (NORVASC) 2.5 MG tablet Take 1 tablet (2.5 mg total) by mouth daily.   aspirin 81 MG tablet Take 81 mg by mouth daily.   cholecalciferol (VITAMIN D) 1000 UNITS tablet Take 1 tablet (1,000 Units total) by mouth daily.    lisinopril (ZESTRIL) 20 MG tablet Take 1 tablet (20 mg total) by mouth daily.   simvastatin (ZOCOR) 20 MG tablet Take 1 tablet (20 mg total) by mouth daily at 6 PM. (Needs to be seen before next refill)   [DISCONTINUED] [facility] injection    No facility-administered encounter medications on file as of 01/18/2022.    Past Surgical History:  Procedure Laterality Date   ABDOMINAL HYSTERECTOMY  07/26/2005   BREAST BIOPSY     unsure of side date or location    Family History  Problem Relation Age of Onset   Hyperparathyroidism Other        neg fam hx   Hypertension Mother    Arthritis Mother    Heart disease Mother    Alcohol abuse Father    Hypertension Sister    Arthritis Sister    Hypertension Brother    Arthritis Brother    Hypertension Sister    Arthritis Sister       Controlled substance contract: n/a    Review of Systems  Constitutional:  Negative for diaphoresis.  Eyes:  Negative for pain.  Respiratory:  Negative for shortness of breath.   Cardiovascular:  Negative for chest pain, palpitations and leg swelling.  Gastrointestinal:  Negative for abdominal pain.  Endocrine: Negative for polydipsia.  Skin:  Negative for rash.  Neurological:  Negative for dizziness, weakness and headaches.  Hematological:  Does not bruise/bleed easily.  All other systems reviewed and are negative.      Objective:   Physical Exam Vitals and nursing note reviewed.  Constitutional:      General: She is not in acute distress.    Appearance: Normal appearance. She is well-developed.  HENT:     Head: Normocephalic.     Right Ear: Tympanic membrane normal.     Left Ear: Tympanic membrane normal.     Nose: Nose normal.     Mouth/Throat:     Mouth: Mucous membranes are moist.  Eyes:     Pupils: Pupils are equal, round, and reactive to light.  Neck:     Vascular: No carotid bruit or JVD.  Cardiovascular:     Rate and Rhythm: Regular rhythm. Bradycardia present.     Heart  sounds: Normal heart sounds.  Pulmonary:     Effort: Pulmonary effort is normal. No respiratory distress.     Breath sounds: Normal breath sounds. No wheezing or rales.  Chest:     Chest wall: No tenderness.  Abdominal:     General: Bowel sounds are normal. There is no distension or abdominal bruit.     Palpations: Abdomen is soft. There is no hepatomegaly, splenomegaly, mass or pulsatile mass.     Tenderness: There is no abdominal tenderness.  Musculoskeletal:        General: Normal range of motion.     Cervical back: Normal range of motion and neck supple.  Lymphadenopathy:     Cervical: No cervical adenopathy.  Skin:    General: Skin is warm and dry.  Neurological:     Mental Status: She is alert and oriented to person, place, and time.     Deep Tendon Reflexes: Reflexes are normal and symmetric.  Psychiatric:        Behavior: Behavior normal.        Thought Content: Thought content normal.        Judgment: Judgment normal.   BP (!) 169/81   Pulse (!) 44   Temp (!) 97.5 F (36.4 C) (Temporal)   Resp 20   Ht 5\' 1"  (1.549 m)   Wt 165 lb (74.8 kg)   SpO2 97%   BMI 31.18 kg/m          Assessment & Plan:  Mariah Fletcher comes in today with chief complaint of Medical Management of Chronic Issues   Diagnosis and orders addressed:  1. Hyperparathyroidism, primary ([facility]) Labs pending - CBC with Differential/Platelet - CMP14+EGFR - Lipid panel  2. Hyperlipidemia with target LDL less than 100 Low fat diet - PTH, Intact and Calcium  3. Osteopenia of lumbar spine Weight bearing exercises  4. Bradycardia consulted with DR . Dettinger - EKG 12-Lead - Ambulatory referral to Cardiology- stat   5. Vitamin D deficiency Continue daily vitamin d supplement  6. BMI 33.0-33.9,adult Discussed diet and exercise for person with BMI >25 Will recheck weight in 3-6 months    Labs pending Health Maintenance reviewed Diet and exercise encouraged  Follow up plan: 3  months   Mary-Margaret Daphine Deutscher, FNP

## 2022-01-18 NOTE — Progress Notes (Deleted)
Cardiology Office Note   Date:  01/18/2022   ID:  Jacquiline Zurcher, DOB 1935/06/03, MRN [id number]  PCP:  Bennie Pierini, FNP  Cardiologist:   Rollene Rotunda, MD Referring:  Bennie Pierini, FNP  No chief complaint on file.     History of Present Illness: Mariah Fletcher is a 87 y.o. female who is referred by Bennie Pierini, FNP for evaluation of bradycardia.  ***   ***  She has no past cardiac history.  She lives alone.  She has to go up 5 steps to her apartment.  She does her own household chores including vacuuming.  She denies any cardiovascular symptoms except for some rare dizziness.  She does not describe orthostatic symptoms.  She does not have palpitations.  She has had no presyncope or syncope.  She denies any chest pressure, neck or arm discomfort.  She has had no weight gain or edema.  She has noticed her blood pressure has been high.      Past Medical History:  Diagnosis Date   Allergy    Hyperlipidemia    Hypertension     Past Surgical History:  Procedure Laterality Date   ABDOMINAL HYSTERECTOMY  07/26/2005   BREAST BIOPSY     unsure of side date or location     Current Outpatient Medications  Medication Sig Dispense Refill   amLODipine (NORVASC) 2.5 MG tablet Take 1 tablet (2.5 mg total) by mouth daily. 90 tablet 1   aspirin 81 MG tablet Take 81 mg by mouth daily.     cholecalciferol (VITAMIN D) 1000 UNITS tablet Take 1 tablet (1,000 Units total) by mouth daily.     lisinopril (ZESTRIL) 20 MG tablet Take 1 tablet (20 mg total) by mouth daily. 90 tablet 1   simvastatin (ZOCOR) 20 MG tablet Take 1 tablet (20 mg total) by mouth daily at 6 PM. (Needs to be seen before next refill) 90 tablet 1   No current facility-administered medications for this visit.    Allergies:   Patient has no known allergies.    ROS:  Please see the history of present illness.   Otherwise, review of systems are positive for *** .   All other systems are reviewed and  negative.    PHYSICAL EXAM: VS:  There were no vitals taken for this visit. , BMI There is no height or weight on file to calculate BMI. GENERAL:  Well appearing NECK:  No jugular venous distention, waveform within normal limits, carotid upstroke brisk and symmetric, no bruits, no thyromegaly LUNGS:  Clear to auscultation bilaterally CHEST:  Unremarkable HEART:  PMI not displaced or sustained,S1 and S2 within normal limits, no S3, no S4, no clicks, no rubs, *** murmurs ABD:  Flat, positive bowel sounds normal in frequency in pitch, no bruits, no rebound, no guarding, no midline pulsatile mass, no hepatomegaly, no splenomegaly EXT:  2 plus pulses throughout, no edema, no cyanosis no clubbing     ***GENERAL:  Well appearing HEENT:  Pupils equal round and reactive, fundi not visualized, oral mucosa unremarkable NECK:  No jugular venous distention, waveform within normal limits, carotid upstroke brisk and symmetric, no bruits, no thyromegaly LYMPHATICS:  No cervical, inguinal adenopathy LUNGS:  Clear to auscultation bilaterally BACK:  No CVA tenderness CHEST:  Unremarkable HEART:  PMI not displaced or sustained,S1 and S2 within normal limits, no S3, no S4, no clicks, no rubs, soft right upper sternal border systolic murmur early peaking and nonradiating, no diastolic murmurs ABD:  Flat, positive bowel sounds normal in frequency in pitch, no bruits, no rebound, no guarding, no midline pulsatile mass, no hepatomegaly, no splenomegaly EXT:  2 plus pulses throughout, no edema, no cyanosis no clubbing SKIN:  No rashes no nodules NEURO:  Cranial nerves II through XII grossly intact, motor grossly intact throughout PSYCH:  Cognitively intact, oriented to person place and time    EKG:  EKG is *** ordered today. The ekg ordered today demonstrates sinus bradycardia, rate ***, left axis deviation, poor anterior R wave progression, no acute ST-T wave changes.   Recent Labs: 07/16/2021: ALT 12;  BUN 12; Creatinine, Ser 0.68; Hemoglobin 12.8; Platelets 203; Potassium 4.5; Sodium 140; TSH 1.630    Lipid Panel    Component Value Date/Time   CHOL 159 07/16/2021 1214   TRIG 98 07/16/2021 1214   TRIG 106 11/18/2014 1028   HDL 65 07/16/2021 1214   HDL 54 11/18/2014 1028   CHOLHDL 2.4 07/16/2021 1214   LDLCALC 76 07/16/2021 1214   LDLCALC 67 03/11/2014 1048      Wt Readings from Last 3 Encounters:  01/18/22 165 lb (74.8 kg)  07/16/21 164 lb (74.4 kg)  05/25/21 165 lb (74.8 kg)      Other studies Reviewed: Additional studies/ records that were reviewed today include: *** Review of the above records demonstrates:  Please see elsewhere in the note.     ASSESSMENT AND PLAN:  BRADYCARDIA:   ***  She has bradycardia but she does not have any symptoms related to this.  At this point no change in therapy.  HTN: Her blood pressure is *** elevated.  I will start Norvasc 2.5 mg daily.  She will give me a blood pressure diary in a couple of weeks.  MURMUR:    ***  he has a soft apical systolic murmur.  No change in therapy.  DYSLIPIDEMIA: LDL was ***  xcellent at 69 with an HDL of 65.  No change in therapy.   Current medicines are reviewed at length with the patient today.  The patient does not have concerns regarding medicines.  The following changes have been made: As above  Labs/ tests ordered today include:  ***  No orders of the defined types were placed in this encounter.    Disposition:   FU with me ***   Signed, Rollene Rotunda, MD  01/18/2022 9:21 PM    [facility]

## 2022-01-19 ENCOUNTER — Telehealth: Payer: Self-pay | Admitting: Cardiology

## 2022-01-19 ENCOUNTER — Ambulatory Visit: Payer: Medicare HMO | Admitting: Cardiology

## 2022-01-19 DIAGNOSIS — E785 Hyperlipidemia, unspecified: Secondary | ICD-10-CM

## 2022-01-19 DIAGNOSIS — R001 Bradycardia, unspecified: Secondary | ICD-10-CM

## 2022-01-19 DIAGNOSIS — I10 Essential (primary) hypertension: Secondary | ICD-10-CM

## 2022-01-19 LAB — CMP14+EGFR
ALT: 11 IU/L (ref 0–32)
AST: 11 IU/L (ref 0–40)
Albumin/Globulin Ratio: 1.3 (ref 1.2–2.2)
Albumin: 4 g/dL (ref 3.6–4.6)
Alkaline Phosphatase: 56 IU/L (ref 44–121)
BUN/Creatinine Ratio: 15 (ref 12–28)
BUN: 9 mg/dL (ref 8–27)
Bilirubin Total: 1 mg/dL (ref 0.0–1.2)
CO2: 26 mmol/L (ref 20–29)
Calcium: 10.9 mg/dL — ABNORMAL HIGH (ref 8.7–10.3)
Chloride: 104 mmol/L (ref 96–106)
Creatinine, Ser: 0.59 mg/dL (ref 0.57–1.00)
Globulin, Total: 3 g/dL (ref 1.5–4.5)
Glucose: 83 mg/dL (ref 70–99)
Potassium: 4.1 mmol/L (ref 3.5–5.2)
Sodium: 140 mmol/L (ref 134–144)
Total Protein: 7 g/dL (ref 6.0–8.5)
eGFR: 89 mL/min/{1.73_m2} (ref >59)

## 2022-01-19 LAB — CBC WITH DIFFERENTIAL/PLATELET
Basophils Absolute: 0.1 10*3/uL (ref 0.0–0.2)
Basos: 1 %
EOS (ABSOLUTE): 0.1 10*3/uL (ref 0.0–0.4)
Eos: 2 %
Hematocrit: 35.6 % (ref 34.0–46.6)
Hemoglobin: 12.1 g/dL (ref 11.1–15.9)
Immature Grans (Abs): 0 10*3/uL (ref 0.0–0.1)
Immature Granulocytes: 0 %
Lymphocytes Absolute: 2.3 10*3/uL (ref 0.7–3.1)
Lymphs: 49 %
MCH: 30.4 pg (ref 26.6–33.0)
MCHC: 34 g/dL (ref 31.5–35.7)
MCV: 89 fL (ref 79–97)
Monocytes Absolute: 0.3 10*3/uL (ref 0.1–0.9)
Monocytes: 7 %
Neutrophils Absolute: 1.9 10*3/uL (ref 1.4–7.0)
Neutrophils: 41 %
Platelets: 165 10*3/uL (ref 150–450)
RBC: 3.98 x10E6/uL (ref 3.77–5.28)
RDW: 12.7 % (ref 11.7–15.4)
WBC: 4.6 10*3/uL (ref 3.4–10.8)

## 2022-01-19 LAB — LIPID PANEL
Chol/HDL Ratio: 2.1 ratio (ref 0.0–4.4)
Cholesterol, Total: 125 mg/dL (ref 100–199)
HDL: 59 mg/dL (ref >39)
LDL Chol Calc (NIH): 47 mg/dL (ref 0–99)
Triglycerides: 105 mg/dL (ref 0–149)
VLDL Cholesterol Cal: 19 mg/dL (ref 5–40)

## 2022-01-19 LAB — PTH, INTACT AND CALCIUM: PTH: 63 pg/mL (ref 15–65)

## 2022-01-19 NOTE — Telephone Encounter (Signed)
Patient said she does not have a ride to her appointment today (01/19/22).  She was hoping someone could work her in to the [place] office when Dr. Antoine Poche is there.

## 2022-01-20 ENCOUNTER — Ambulatory Visit (INDEPENDENT_AMBULATORY_CARE_PROVIDER_SITE_OTHER): Payer: Medicare HMO | Admitting: Cardiology

## 2022-01-20 ENCOUNTER — Encounter: Payer: Self-pay | Admitting: Cardiology

## 2022-01-20 VITALS — BP 181/91 | HR 55 | Ht 61.0 in | Wt 163.0 lb

## 2022-01-20 DIAGNOSIS — R001 Bradycardia, unspecified: Secondary | ICD-10-CM | POA: Diagnosis not present

## 2022-01-20 NOTE — Telephone Encounter (Signed)
Spoke with pt and added her on to Dr Hochrein's schedule for today at 2:20 pm.  She will call back if unable to keep the appt.

## 2022-01-20 NOTE — Progress Notes (Signed)
Cardiology Office Note   Date:  01/20/2022   ID:  Mariah Fletcher, DOB [birth year], MRN [id number]  PCP:  Bennie Pierini, FNP  Cardiologist:   Rollene Rotunda, MD Referring:  Bennie Pierini, FNP  Chief Complaint  Patient presents with   Bradycardia      History of Present Illness: Mariah Fletcher is a 87 y.o. female who is referred by Bennie Pierini, FNP for evaluation of bradycardia.  Her EKG was 40 a couple days ago on an EKG.  I saw her about a year and a half ago for bradycardia.  She has no past cardiac history.  She has never had any symptoms related to heart rate has been in the 40s.  She denies any presyncope or syncope.  She lives alone.  She does her own grocery shopping.  She vacuums.  She walks up 5 stairs to her apartment.  She denies any shortness of breath, PND or orthopnea.  She has not had any chest pressure, neck or arm discomfort.    She was added to my schedule today.   Past Medical History:  Diagnosis Date   Allergy    Hyperlipidemia    Hypertension     Past Surgical History:  Procedure Laterality Date   ABDOMINAL HYSTERECTOMY  07/26/2005   BREAST BIOPSY     unsure of side date or location     Current Outpatient Medications  Medication Sig Dispense Refill   amLODipine (NORVASC) 2.5 MG tablet Take 1 tablet (2.5 mg total) by mouth daily. 90 tablet 1   aspirin 81 MG tablet Take 81 mg by mouth daily.     cholecalciferol (VITAMIN D) 1000 UNITS tablet Take 1 tablet (1,000 Units total) by mouth daily.     lisinopril (ZESTRIL) 20 MG tablet Take 1 tablet (20 mg total) by mouth daily. 90 tablet 1   simvastatin (ZOCOR) 20 MG tablet Take 1 tablet (20 mg total) by mouth daily at 6 PM. (Needs to be seen before next refill) 90 tablet 1   No current facility-administered medications for this visit.    Allergies:   Patient has no known allergies.    ROS:  Please see the history of present illness.   Otherwise, review of systems are positive for  none .   All other systems are reviewed and negative.    PHYSICAL EXAM: VS:  BP (!) 181/91 (BP Location: Right Arm, Patient Position: Sitting, Cuff Size: Normal)   Pulse (!) 55   Ht 5\' 1"  (1.549 m)   Wt 163 lb (73.9 kg)   SpO2 95%   BMI 30.80 kg/m  , BMI Body mass index is 30.8 kg/m. GENERAL:  Well appearing NECK:  No jugular venous distention, waveform within normal limits, carotid upstroke brisk and symmetric, no bruits, no thyromegaly LUNGS:  Clear to auscultation bilaterally CHEST:  Unremarkable HEART:  PMI not displaced or sustained,S1 and S2 within normal limits, no S3, no S4, no clicks, no rubs, 2 out of 6 brief apical systolic murmur heard best at the right upper sternal border and early peaking, no diastolic murmurs ABD:  Flat, positive bowel sounds normal in frequency in pitch, no bruits, no rebound, no guarding, no midline pulsatile mass, no hepatomegaly, no splenomegaly EXT:  2 plus pulses throughout, no edema, no cyanosis no clubbing    EKG:  EKG is not ordered today. The ekg 01/18/2022 today demonstrates sinus bradycardia, rate 40, left axis deviation, poor anterior R wave progression, no acute ST-T wave changes.  Recent Labs: 07/16/2021: TSH 1.630 01/18/2022: ALT 11; BUN 9; Creatinine, Ser 0.59; Hemoglobin 12.1; Platelets 165; Potassium 4.1; Sodium 140    Lipid Panel    Component Value Date/Time   CHOL 125 01/18/2022 1250   TRIG 105 01/18/2022 1250   TRIG 106 11/18/2014 1028   HDL 59 01/18/2022 1250   HDL 54 11/18/2014 1028   CHOLHDL 2.1 01/18/2022 1250   LDLCALC 47 01/18/2022 1250   LDLCALC 67 03/11/2014 1048      Wt Readings from Last 3 Encounters:  01/20/22 163 lb (73.9 kg)  01/18/22 165 lb (74.8 kg)  07/16/21 164 lb (74.4 kg)      Other studies Reviewed: Additional studies/ records that were reviewed today include: Labs Review of the above records demonstrates:  Please see elsewhere in the note.     ASSESSMENT AND PLAN:  BRADYCARDIA:   She  continues to have no symptoms related to this.  At this point no change in therapy or further testing is indicated.  There is no indication for pacemaker.  She would let me know if she has any presyncope or syncope going forward.   HTN: Her blood pressure is elevated today but she says it is in the 130s systolic at home.  She is going to keep a blood pressure diary.   MURMUR:    Murmur is soft and seems to be unchanged from what I described previously.  No change in therapy.   DYSLIPIDEMIA: LDL was 76 with an HDL of 65.  No change in therapy.   Current medicines are reviewed at length with the patient today.  The patient does not have concerns regarding medicines.  The following changes have been made: As above  Labs/ tests ordered today include: None  No orders of the defined types were placed in this encounter.    Disposition:   FU with me 12 months   Signed, Rollene Rotunda, MD  01/20/2022 3:30 PM    [facility]

## 2022-01-20 NOTE — Telephone Encounter (Signed)
Appointment today with Dr. Antoine Poche

## 2022-01-20 NOTE — Patient Instructions (Signed)

## 2022-01-25 ENCOUNTER — Encounter: Payer: Self-pay | Admitting: Cardiology

## 2022-04-06 ENCOUNTER — Other Ambulatory Visit: Payer: Self-pay | Admitting: Nurse Practitioner

## 2022-04-06 DIAGNOSIS — I10 Essential (primary) hypertension: Secondary | ICD-10-CM

## 2022-04-20 ENCOUNTER — Ambulatory Visit (INDEPENDENT_AMBULATORY_CARE_PROVIDER_SITE_OTHER): Payer: Medicare HMO | Admitting: Nurse Practitioner

## 2022-04-20 ENCOUNTER — Encounter: Payer: Self-pay | Admitting: Nurse Practitioner

## 2022-04-20 VITALS — BP 147/79 | HR 50 | Temp 97.7°F | Ht 61.0 in | Wt 161.1 lb

## 2022-04-20 DIAGNOSIS — Z6833 Body mass index (BMI) 33.0-33.9, adult: Secondary | ICD-10-CM | POA: Diagnosis not present

## 2022-04-20 DIAGNOSIS — I10 Essential (primary) hypertension: Secondary | ICD-10-CM | POA: Diagnosis not present

## 2022-04-20 DIAGNOSIS — E210 Primary hyperparathyroidism: Secondary | ICD-10-CM | POA: Diagnosis not present

## 2022-04-20 DIAGNOSIS — E785 Hyperlipidemia, unspecified: Secondary | ICD-10-CM | POA: Diagnosis not present

## 2022-04-20 DIAGNOSIS — E559 Vitamin D deficiency, unspecified: Secondary | ICD-10-CM

## 2022-04-20 DIAGNOSIS — M8588 Other specified disorders of bone density and structure, other site: Secondary | ICD-10-CM

## 2022-04-20 DIAGNOSIS — R001 Bradycardia, unspecified: Secondary | ICD-10-CM | POA: Diagnosis not present

## 2022-04-20 MED ORDER — AMLODIPINE BESYLATE 2.5 MG PO TABS: 2.5000 mg | ORAL_TABLET | Freq: Every day | ORAL | 1 refills | Status: DC

## 2022-04-20 MED ORDER — SIMVASTATIN 20 MG PO TABS: 20.0000 mg | ORAL_TABLET | Freq: Every day | ORAL | 1 refills | Status: DC

## 2022-04-20 MED ORDER — LISINOPRIL 20 MG PO TABS: 20.0000 mg | ORAL_TABLET | Freq: Every day | ORAL | 1 refills | Status: DC

## 2022-04-20 NOTE — Progress Notes (Signed)
Subjective:    Patient ID: Mariah Fletcher, female    DOB: [birth year], 87 y.o.   MRN: [id number]   Chief Complaint: medical management of chronic issues   HPI:  Mariah Fletcher is a 87 y.o. who identifies as a female who was assigned female at birth.   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 Essential hypertension, benign No c/o chest pain, sob or headache. Does not check BP at home. BP Readings from Last 3 Encounters:  04/20/22 (!) 147/79  01/20/22 (!) 181/91  01/18/22 (!) 169/81     2. Hyperlipidemia with target LDL less than 100 Does try to watch diet but no dedicated exercise routine. Lab Results  Component Value Date   CHOL 125 01/18/2022   HDL 59 01/18/2022   LDLCALC 47 01/18/2022   TRIG 105 01/18/2022   CHOLHDL 2.1 01/18/2022     3. Hyperparathyroidism, primary ([place]) No problems she is aware of. Lab Results  Component Value Date   PTH 63 01/18/2022   PTH Comment 01/18/2022   CALCIUM 10.9 (H) 01/18/2022     4. Osteopenia of lumbar spine Does no weight bearing exercises. Last dexascan was done on 07/16/21; t-score was 1.8.  5. Bradycardia She has history of low heart rate; no dizziness, syncope or near syncopal episodes. Last visit with cardiology was on 01/20/22; per note no changes made.  Pulse Readings from Last 3 Encounters:  04/20/22 (!) 50  01/20/22 (!) 55  01/18/22 (!) 44     6. BMI 33.0-33.9,adult Weight is down 2 lbs since last check. Wt Readings from Last 3 Encounters:  04/20/22 161 lb 2 oz (73.1 kg)  01/20/22 163 lb (73.9 kg)  01/18/22 165 lb (74.8 kg)    BMI Readings from Last 3 Encounters:  04/20/22 30.44 kg/m  01/20/22 30.80 kg/m  01/18/22 31.18 kg/m     7. Vitamin D deficiency Takes daily Vitamin D supplement.   New complaints: None today.  No Known Allergies Outpatient  Encounter Medications as of 04/20/2022  Medication Sig   amLODipine (NORVASC) 2.5 MG tablet Take 1 tablet (2.5 mg total) by mouth daily.   aspirin 81 MG tablet Take 81 mg by mouth daily.   cholecalciferol (VITAMIN D) 1000 UNITS tablet Take 1 tablet (1,000 Units total) by mouth daily.   lisinopril (ZESTRIL) 20 MG tablet TAKE 1 TABLET BY MOUTH EVERY DAY   simvastatin (ZOCOR) 20 MG tablet Take 1 tablet (20 mg total) by mouth daily at 6 PM. (Needs to be seen before next refill)   No facility-administered encounter medications on file as of 04/20/2022.    Past Surgical History:  Procedure Laterality Date   ABDOMINAL HYSTERECTOMY  07/26/2005   BREAST BIOPSY     unsure of side date or location    Family History  Problem Relation Age of Onset   Hyperparathyroidism Other        neg fam hx   Hypertension Mother    Arthritis Mother    Heart disease Mother    Alcohol abuse Father    Hypertension Sister    Arthritis Sister    Hypertension Brother    Arthritis Brother    Hypertension Sister    Arthritis Sister       Controlled substance contract: n/a     Review of Systems  Constitutional:  Negative for diaphoresis.  HENT:  Negative for ear pain.   Eyes:  Negative for pain.  Respiratory:  Negative for chest tightness and shortness of breath.   Cardiovascular:  Negative for chest pain, palpitations and leg swelling.  Gastrointestinal:  Negative for abdominal pain.  Endocrine: Negative for polydipsia.  Genitourinary: Negative.   Musculoskeletal:  Negative for back pain and myalgias.  Skin:  Negative for rash.  Neurological:  Negative for syncope, weakness, light-headedness and headaches.  Hematological:  Does not bruise/bleed easily.  All other systems reviewed and are negative.      Objective:   Physical Exam Nursing note reviewed.  Constitutional:      General: She is not in acute distress.    Appearance: Normal appearance. She is well-developed and well-groomed.  HENT:      Head: Normocephalic and atraumatic.     Right Ear: Tympanic membrane normal.     Left Ear: Tympanic membrane normal.     Nose: Nose normal.     Mouth/Throat:     Lips: Pink.     Mouth: Mucous membranes are moist.     Pharynx: Oropharynx is clear.  Eyes:     Pupils: Pupils are equal, round, and reactive to light.  Neck:     Thyroid: No thyroid mass, thyromegaly or thyroid tenderness.     Vascular: No carotid bruit or JVD.  Cardiovascular:     Rate and Rhythm: Regular rhythm. Bradycardia present.     Pulses: Normal pulses.     Heart sounds: S1 normal and S2 normal. Murmur heard.  Pulmonary:     Effort: Pulmonary effort is normal.     Breath sounds: Normal breath sounds. No wheezing, rhonchi or rales.  Chest:     Chest wall: No tenderness.  Abdominal:     General: Bowel sounds are normal. There is no distension.     Palpations: Abdomen is soft. There is no mass.     Tenderness: There is no abdominal tenderness.  Musculoskeletal:     Cervical back: Normal range of motion and neck supple.     Right ankle: Swelling present.     Left ankle: Swelling present.  Lymphadenopathy:     Cervical: No cervical adenopathy.  Skin:    General: Skin is warm and dry.     Capillary Refill: Capillary refill takes less than 2 seconds.     Findings: No rash.  Neurological:     Mental Status: She is alert and oriented to person, place, and time.  Psychiatric:        Mood and Affect: Mood normal.        Behavior: Behavior normal.        Thought Content: Thought content normal.        Judgment: Judgment normal.    BP (!) 147/79   Pulse (!) 50   Temp 97.7 F (36.5 C) (Temporal)   Ht _0  (1.549 m)   Wt 161 lb 2 oz (73.1 kg)   SpO2 98%   BMI 30.44 kg/m         Assessment & Plan:  Mariah Fletcher comes in today with chief complaint of Medical Management of Chronic Issues and Hypertension   Diagnosis and orders addressed:  1. Essential hypertension, benign Low sodium diet. -  lisinopril (ZESTRIL) 20 MG tablet; Take 1 tablet (20 mg total) by mouth daily.  Dispense: 90 tablet; Refill: 1 - amLODipine (NORVASC) 2.5 MG tablet; Take 1 tablet (2.5 mg total) by mouth daily.  Dispense: 90  tablet; Refill: 1 - CBC with Differential/Platelet - CMP14+EGFR  2. Hyperlipidemia with target LDL less than 100 Low fat diet. - simvastatin (ZOCOR) 20 MG tablet; Take 1 tablet (20 mg total) by mouth daily at 6 PM. (Needs to be seen before next refill)  Dispense: 90 tablet; Refill: 1 - Lipid panel  3. Hyperparathyroidism, primary ([place]) Labs pending.  4. Osteopenia of lumbar spine Weight bearing exercises.  5. Bradycardia Keep follow-up with cardiology. Report any chest pain, sob, syncope or near syncope.  6. BMI 33.0-33.9,adult Discussed diet and exercise for person with BMI >25. Will recheck weight in 3-6 months.  7. Vitamin D deficiency Continue daily vitamin D supplements.   Labs pending Health Maintenance reviewed Diet and exercise encouraged  Follow up plan: 3 months  Collene Leyden, FNP student    [place], [place]

## 2022-04-21 LAB — CBC WITH DIFFERENTIAL/PLATELET
Basophils Absolute: 0.1 10*3/uL (ref 0.0–0.2)
Basos: 1 %
EOS (ABSOLUTE): 0.1 10*3/uL (ref 0.0–0.4)
Eos: 2 %
Hematocrit: 36.4 % (ref 34.0–46.6)
Hemoglobin: 12.1 g/dL (ref 11.1–15.9)
Immature Grans (Abs): 0 10*3/uL (ref 0.0–0.1)
Immature Granulocytes: 0 %
Lymphocytes Absolute: 1.9 10*3/uL (ref 0.7–3.1)
Lymphs: 41 %
MCH: 29.9 pg (ref 26.6–33.0)
MCHC: 33.2 g/dL (ref 31.5–35.7)
MCV: 90 fL (ref 79–97)
Monocytes Absolute: 0.4 10*3/uL (ref 0.1–0.9)
Monocytes: 8 %
Neutrophils Absolute: 2.2 10*3/uL (ref 1.4–7.0)
Neutrophils: 48 %
Platelets: 170 10*3/uL (ref 150–450)
RBC: 4.05 x10E6/uL (ref 3.77–5.28)
RDW: 13.1 % (ref 11.7–15.4)
WBC: 4.5 10*3/uL (ref 3.4–10.8)

## 2022-04-21 LAB — LIPID PANEL
Chol/HDL Ratio: 2.1 ratio (ref 0.0–4.4)
Cholesterol, Total: 130 mg/dL (ref 100–199)
HDL: 62 mg/dL (ref >39)
LDL Chol Calc (NIH): 51 mg/dL (ref 0–99)
Triglycerides: 91 mg/dL (ref 0–149)
VLDL Cholesterol Cal: 17 mg/dL (ref 5–40)

## 2022-04-21 LAB — CMP14+EGFR
ALT: 10 IU/L (ref 0–32)
AST: 16 IU/L (ref 0–40)
Albumin/Globulin Ratio: 1.4 (ref 1.2–2.2)
Albumin: 4.2 g/dL (ref 3.7–4.7)
Alkaline Phosphatase: 60 IU/L (ref 44–121)
BUN/Creatinine Ratio: 13 (ref 12–28)
BUN: 9 mg/dL (ref 8–27)
Bilirubin Total: 1 mg/dL (ref 0.0–1.2)
CO2: 20 mmol/L (ref 20–29)
Calcium: 10.9 mg/dL — ABNORMAL HIGH (ref 8.7–10.3)
Chloride: 105 mmol/L (ref 96–106)
Creatinine, Ser: 0.68 mg/dL (ref 0.57–1.00)
Globulin, Total: 3.1 g/dL (ref 1.5–4.5)
Glucose: 102 mg/dL — ABNORMAL HIGH (ref 70–99)
Potassium: 4.3 mmol/L (ref 3.5–5.2)
Sodium: 141 mmol/L (ref 134–144)
Total Protein: 7.3 g/dL (ref 6.0–8.5)
eGFR: 85 mL/min/{1.73_m2} (ref >59)

## 2022-05-26 ENCOUNTER — Ambulatory Visit (INDEPENDENT_AMBULATORY_CARE_PROVIDER_SITE_OTHER): Payer: Medicare HMO | Admitting: *Deleted

## 2022-05-26 DIAGNOSIS — Z0000 Encounter for general adult medical examination without abnormal findings: Secondary | ICD-10-CM | POA: Diagnosis not present

## 2022-05-26 NOTE — Progress Notes (Signed)
Subjective:   Mariah Fletcher is a 87 y.o. female who presents for Medicare Annual (Subsequent) preventive examination. I connected with  Mariah Fletcher on 25/95/63 by a audio enabled telemedicine application and verified that I am speaking with the correct person using two identifiers.  Patient Location: Home  Provider Location: Office/Clinic  I discussed the limitations of evaluation and management by telemedicine. The patient expressed understanding and agreed to proceed.  Review of Systems     Cardiac Risk Factors include: advanced age (>57men, >28 women);dyslipidemia;hypertension;obesity (BMI >30kg/m2);sedentary lifestyle     Objective:    There were no vitals filed for this visit. There is no height or weight on file to calculate BMI.     05/26/2022   10:13 AM 05/25/2021    9:51 AM 07/12/2019    1:33 PM 06/06/2018   10:21 AM 02/24/2015   10:08 AM  Advanced Directives  Does Patient Have a Medical Advance Directive? No No Yes Yes No  Type of Advance Directive   Living will Living will;Healthcare Power of Attorney   Does patient want to make changes to medical advance directive?   No - Patient declined No - Patient declined   Copy of [place] in Chart?    No - copy requested   Would patient like information on creating a medical advance directive? No - Patient declined No - Patient declined   Yes - Educational materials given    Current Medications (verified) Outpatient Encounter Medications as of 05/26/2022  Medication Sig   amLODipine (NORVASC) 2.5 MG tablet Take 1 tablet (2.5 mg total) by mouth daily.   aspirin 81 MG tablet Take 81 mg by mouth daily.   cholecalciferol (VITAMIN D) 1000 UNITS tablet Take 1 tablet (1,000 Units total) by mouth daily.   lisinopril (ZESTRIL) 20 MG tablet Take 1 tablet (20 mg total) by mouth daily.   simvastatin (ZOCOR) 20 MG tablet Take 1 tablet (20 mg total) by mouth daily at 6 PM. (Needs to be seen before next refill)   No  facility-administered encounter medications on file as of 05/26/2022.    Allergies (verified) Patient has no known allergies.   History: Past Medical History:  Diagnosis Date   Allergy    Hyperlipidemia    Hypertension    Past Surgical History:  Procedure Laterality Date   ABDOMINAL HYSTERECTOMY  07/26/2005   BREAST BIOPSY     unsure of side date or location   Family History  Problem Relation Age of Onset   Hyperparathyroidism Other        neg fam hx   Hypertension Mother    Arthritis Mother    Heart disease Mother    Alcohol abuse Father    Hypertension Sister    Arthritis Sister    Hypertension Brother    Arthritis Brother    Hypertension Sister    Arthritis Sister    Social History   Socioeconomic History   Marital status: Single    Spouse name: Not on file   Number of children: 2   Years of education: 12   Highest education level: Not on file  Occupational History   Occupation: retired  Tobacco Use   Smoking status: Never   Smokeless tobacco: Never  Vaping Use   Vaping Use: Never used  Substance and Sexual Activity   Alcohol use: Yes    Comment: Rarely drinks wine    Drug use: Never   Sexual activity: Not Currently  Other Topics Concern  Not on file  Social History Narrative   2 sons   4 grandchildren   Social Determinants of Health   Financial Resource Strain: Low Risk  (05/26/2022)   Overall Financial Resource Strain (CARDIA)    Difficulty of Paying Living Expenses: Not hard at all  Food Insecurity: No Food Insecurity (05/26/2022)   Hunger Vital Sign    Worried About Running Out of Food in the Last Year: Never true    Ran Out of Food in the Last Year: Never true  Transportation Needs: No Transportation Needs (05/26/2022)   PRAPARE - Administrator, Civil Service (Medical): No    Lack of Transportation (Non-Medical): No  Physical Activity: Insufficiently Active (05/26/2022)   Exercise Vital Sign    Days of Exercise per Week: 3  days    Minutes of Exercise per Session: 20 min  Stress: No Stress Concern Present (05/26/2022)   [employer] of Occupational Health - Occupational Stress Questionnaire    Feeling of Stress : Not at all  Social Connections: Moderately Integrated (05/26/2022)   Social Connection and Isolation Panel [NHANES]    Frequency of Communication with Friends and Family: More than three times a week    Frequency of Social Gatherings with Friends and Family: More than three times a week    Attends Religious Services: More than 4 times per year    Active Member of [employer] or Organizations: Yes    Attends Engineer, structural: More than 4 times per year    Marital Status: Never married    Tobacco Counseling Counseling given: Not Answered   Clinical Intake:  Pre-visit preparation completed: Yes  Pain : No/denies pain     BMI - recorded: 30.46 Nutritional Status: BMI > 30  Obese Diabetes: No  How often do you need to have someone help you when you read instructions, pamphlets, or other written materials from your doctor or pharmacy?: 1 - Never What is the last grade level you completed in school?: 12th  Diabetic?No         Activities of Daily Living    05/26/2022   10:14 AM  In your present state of health, do you have any difficulty performing the following activities:  Hearing? 0  Vision? 0  Difficulty concentrating or making decisions? 1  Comment a little  Walking or climbing stairs? 0  Dressing or bathing? 0  Doing errands, shopping? 0  Preparing Food and eating ? N  Using the Toilet? N  In the past six months, have you accidently leaked urine? Y  Comment 3-4 times in the last year  Do you have problems with loss of bowel control? N  Managing your Medications? N  Managing your Finances? N  Housekeeping or managing your Housekeeping? N    Patient Care Team: Bennie Pierini, FNP as PCP - General (Nurse Practitioner) Rollene Rotunda, MD as PCP -  Cardiology (Cardiology)  Indicate any recent Medical Services you may have received from other than [facility] providers in the past year (date may be approximate).     Assessment:   This is a routine wellness examination for Mariah Fletcher.  Hearing/Vision screen No results found.  Dietary issues and exercise activities discussed: Current Exercise Habits: Home exercise routine, Type of exercise: Other - see comments;walking (exercise peddling device), Time (Minutes): 15, Frequency (Times/Week): 4, Weekly Exercise (Minutes/Week): 60, Intensity: Mild, Exercise limited by: orthopedic condition(s);Other - see comments (age)   Goals Addressed  This Visit's Progress    DIET - REDUCE SUGAR INTAKE   On track    Reduce intake of baked goods.       Depression Screen    05/26/2022   10:12 AM 04/20/2022   10:26 AM 01/18/2022   11:37 AM 07/16/2021   11:45 AM 05/25/2021    9:47 AM 01/13/2021   12:04 PM 11/12/2020    1:45 PM  PHQ 2/9 Scores  PHQ - 2 Score 0 0 0 0 0 0 0  PHQ- 9 Score  0 0 0  0     Fall Risk    05/26/2022   10:14 AM 04/20/2022   10:26 AM 01/18/2022   11:37 AM 07/16/2021   11:45 AM 05/25/2021    9:52 AM  Fall Risk   Falls in the past year? 0 0 0 0 0  Number falls in past yr: 0    0  Injury with Fall? 0    0  Risk for fall due to : Other (Comment)    Impaired balance/gait  Risk for fall due to: Comment Due to age      Follow up Falls evaluation completed    Falls evaluation completed;Falls prevention discussed    FALL RISK PREVENTION PERTAINING TO THE HOME:  Any stairs in or around the home? Yes  If so, are there any without handrails? No  Home free of loose throw rugs in walkways, pet beds, electrical cords, etc? Yes  Adequate lighting in your home to reduce risk of falls? Yes   ASSISTIVE DEVICES UTILIZED TO PREVENT FALLS:  Life alert? No  Use of a cane, walker or w/c? No  Grab bars in the bathroom? Yes  Shower chair or bench in shower? Yes  Elevated toilet  seat or a handicapped toilet? Yes    Cognitive Function:    06/07/2018    9:18 AM  MMSE - Mini Mental State Exam  Orientation to time 5  Orientation to Place 5  Registration 3  Attention/ Calculation 5  Recall 2  Language- name 2 objects 2  Language- repeat 1  Language- follow 3 step command 3  Language- read & follow direction 1  Write a sentence 1  Copy design 1  Total score 29        05/26/2022   10:19 AM 05/25/2021    9:55 AM 07/12/2019    1:36 PM  6CIT Screen  What Year? 0 points 0 points 0 points  What month? 0 points 0 points 0 points  What time? 0 points 0 points 0 points  Count back from 20 0 points 0 points 0 points  Months in reverse 0 points 0 points 0 points  Repeat phrase 0 points 0 points 2 points  Total Score 0 points 0 points 2 points    Immunizations Immunization History  Administered Date(s) Administered   Fluad Quad(high Dose 65+) 04/21/2020, 05/03/2022   Influenza Split 05/16/2013   Influenza, High Dose Seasonal PF 05/20/2014, 05/10/2015, 05/01/2018, 05/01/2019, 05/01/2019, 05/05/2021   Influenza,inj,Quad PF,6+ Mos 04/30/2016   Influenza-Unspecified 04/25/2017   Moderna Sars-Covid-2 Vaccination 10/05/2019, 11/02/2019   Pneumococcal Conjugate-13 11/18/2014   Pneumococcal Polysaccharide-23 06/06/2018    TDAP status: no longer recommended  Flu Vaccine status: Up to date  Pneumococcal vaccine status: Up to date  Covid-19 vaccine status: Information provided on how to obtain vaccines.   Qualifies for Shingles Vaccine? Yes   Zostavax completed No   Shingrix Completed?: No.    Education has been  provided regarding the importance of this vaccine. Patient has been advised to call insurance company to determine out of pocket expense if they have not yet received this vaccine. Advised may also receive vaccine at local pharmacy or Health Dept. Verbalized acceptance and understanding.  Screening Tests Health Maintenance  Topic Date Due   Zoster  Vaccines- Shingrix (1 of 2) Never done   COVID-19 Vaccine (3 - Moderna risk series) 11/30/2019   TETANUS/TDAP  01/19/2023 (Originally 04/25/2020)   Medicare Annual Wellness (AWV)  05/27/2023   Pneumonia Vaccine 37+ Years old  Completed   INFLUENZA VACCINE  Completed   DEXA SCAN  Completed   HPV VACCINES  Aged Out    Health Maintenance  Health Maintenance Due  Topic Date Due   Zoster Vaccines- Shingrix (1 of 2) Never done   COVID-19 Vaccine (3 - Moderna risk series) 11/30/2019    Colorectal cancer screening: No longer required.   Mammogram status: No longer required due to age.  Bone Density status: Completed 07/16/2021. Results reflect: Bone density results: OSTEOPENIA. Repeat every 2 years.  Lung Cancer Screening: (Low Dose CT Chest recommended if Age 57-80 years, 30 pack-year currently smoking OR have quit w/in 15years.) does not qualify.    Additional Screening:  Hepatitis C Screening: does qualify; Completed   Vision Screening: Recommended annual ophthalmology exams for early detection of glaucoma and other disorders of the eye. Is the patient up to date with their annual eye exam?  Yes  Who is the provider or what is the name of the office in which the patient attends annual eye exams? Patient cannot remember If pt is not established with a provider, would they like to be referred to a provider to establish care? No .   Dental Screening: Recommended annual dental exams for proper oral hygiene  Community Resource Referral / Chronic Care Management: CRR required this visit?  No   CCM required this visit?  No      Plan:     I have personally reviewed and noted the following in the patient's chart:   Medical and social history Use of alcohol, tobacco or illicit drugs  Current medications and supplements including opioid prescriptions. Patient is not currently taking opioid prescriptions. Functional ability and status Nutritional status Physical  activity Advanced directives List of other physicians Hospitalizations, surgeries, and ER visits in previous 12 months Vitals Screenings to include cognitive, depression, and falls Referrals and appointments  In addition, I have reviewed and discussed with patient certain preventive protocols, quality metrics, and best practice recommendations. A written personalized care plan for preventive services as well as general preventive health recommendations were provided to patient.     Pernell Dupre, CMA   05/26/2022   Nurse Notes: AVS mailed to patient

## 2022-05-26 NOTE — Patient Instructions (Signed)
  Mariah Fletcher , Thank you for taking time to come for your Medicare Wellness Visit. I appreciate your ongoing commitment to your health goals. Please review the following plan we discussed and let me know if I can assist you in the future.   These are the goals we discussed:  Goals      DIET - REDUCE SUGAR INTAKE     Reduce intake of baked goods.         This is a list of the screening recommended for you and due dates:  Health Maintenance  Topic Date Due   Zoster (Shingles) Vaccine (1 of 2) Never done   COVID-19 Vaccine (3 - Moderna risk series) 11/30/2019   Tetanus Vaccine  01/19/2023*   Medicare Annual Wellness Visit  05/27/2023   Pneumonia Vaccine  Completed   Flu Shot  Completed   DEXA scan (bone density measurement)  Completed   HPV Vaccine  Aged Out  *Topic was postponed. The date shown is not the original due date.

## 2022-08-26 IMAGING — MG MM DIGITAL SCREENING BILAT W/ TOMO AND CAD
8 series · 8 of 24 positions shown · non-contrast
Comparison: Previous exam(s).

CLINICAL DATA: Screening.

EXAM:
DIGITAL SCREENING BILATERAL MAMMOGRAM WITH TOMOSYNTHESIS AND CAD
TECHNIQUE: Bilateral screening digital craniocaudal and mediolateral oblique
mammograms were obtained. Bilateral screening digital breast
tomosynthesis was performed. The images were evaluated with
computer-aided detection.

[L CC synth-2D]
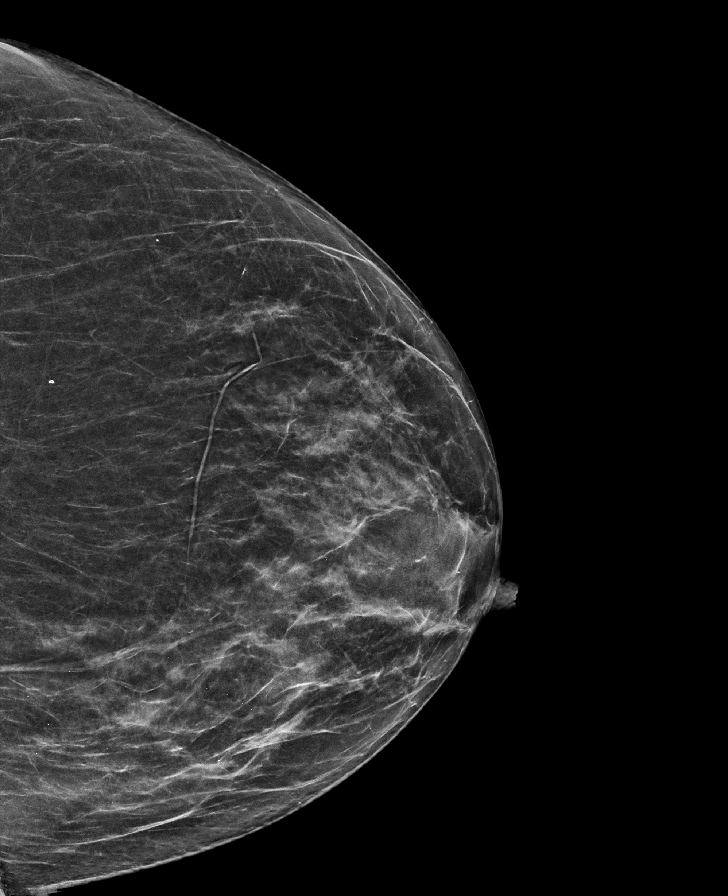

[R CC synth-2D]
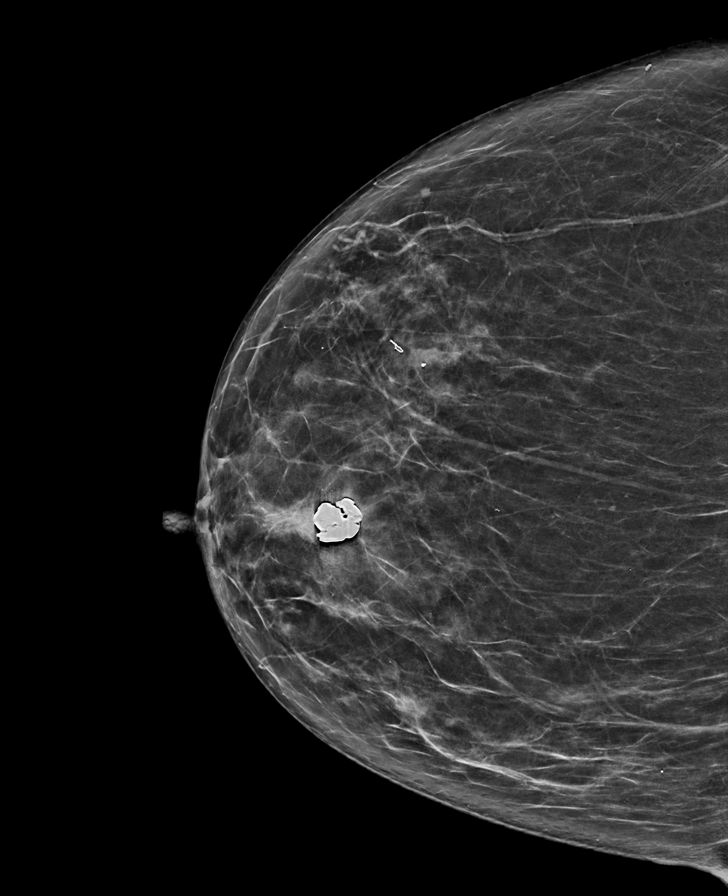

[L MLO synth-2D]
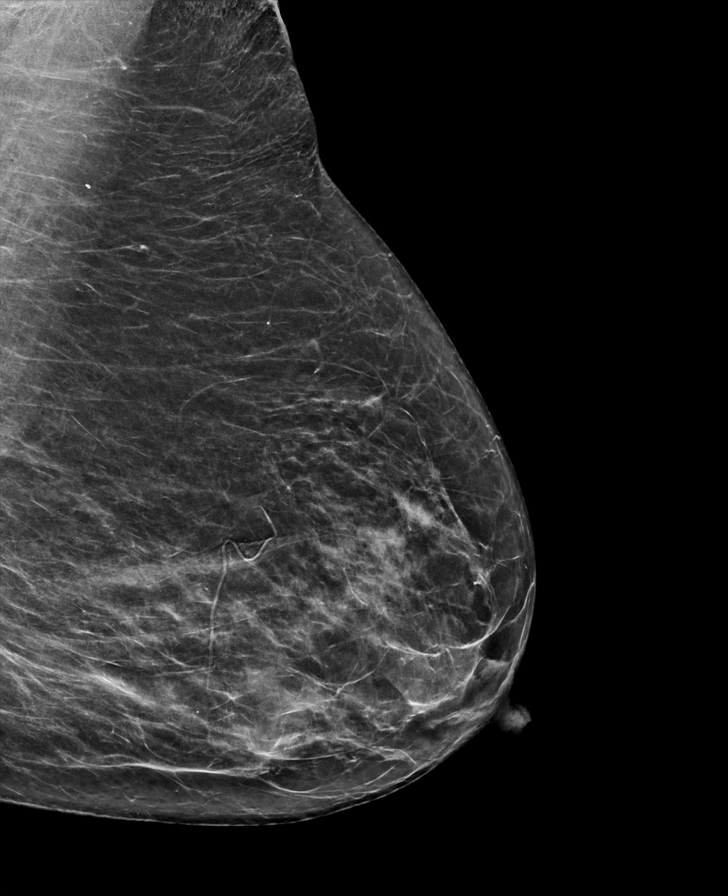

[R MLO synth-2D]
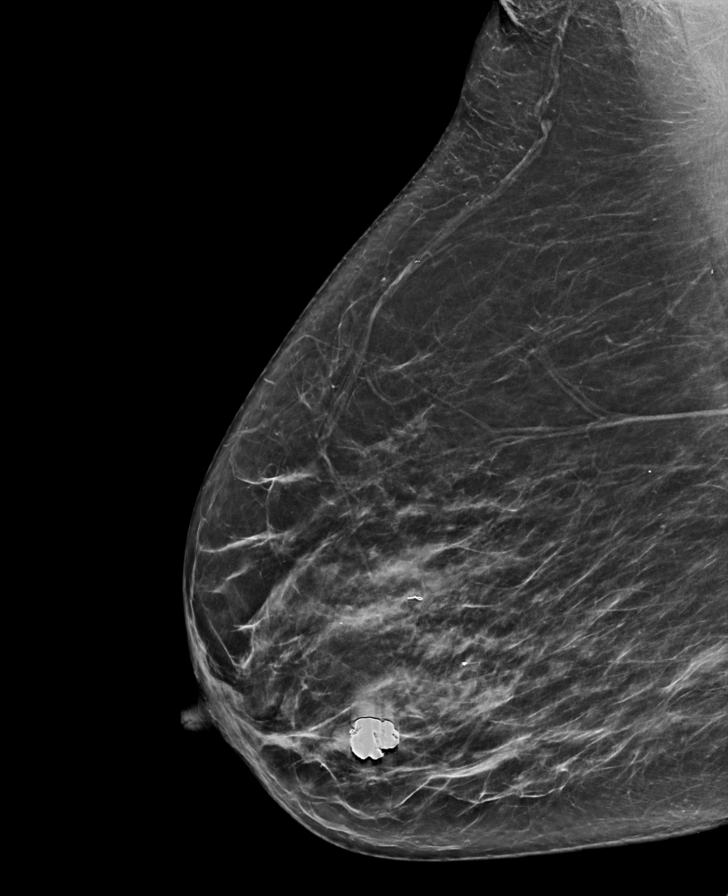

[R CC tomo · tomo slice 34/67.0]
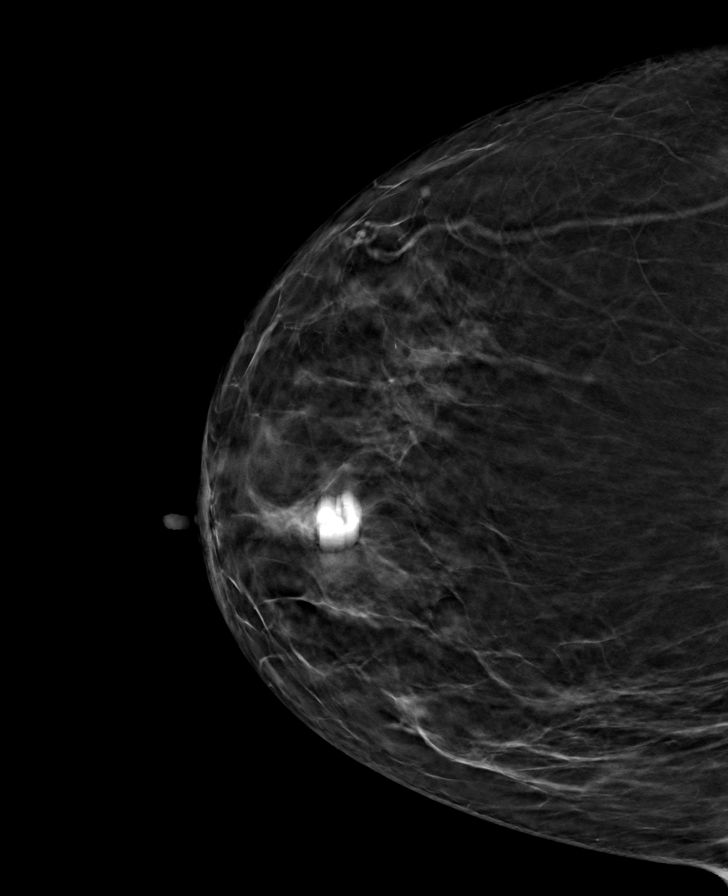

[L CC tomo · tomo slice 32/63.0]
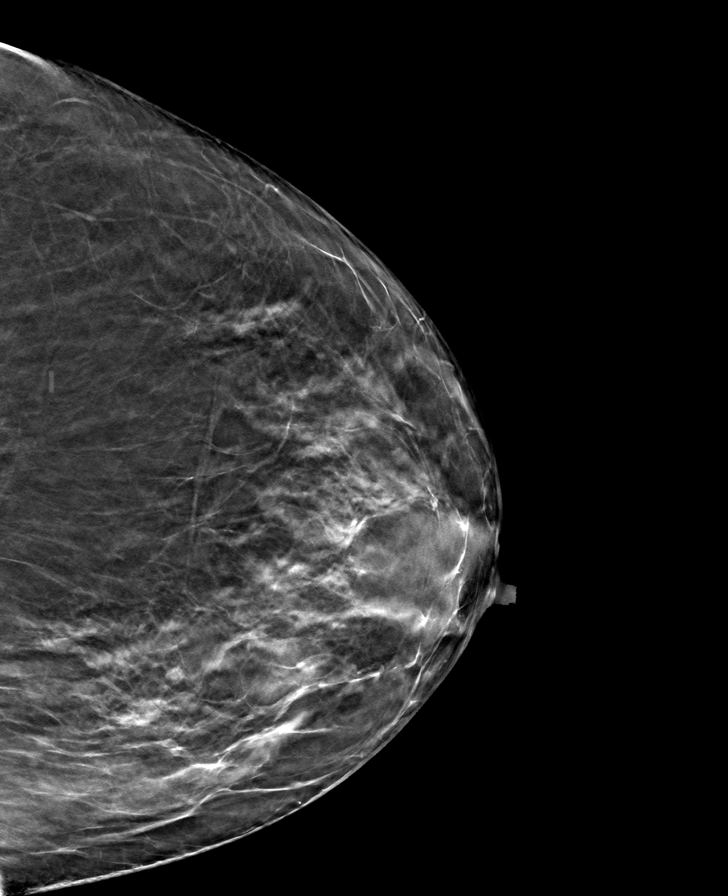

[L MLO tomo · tomo slice 35/68.0]
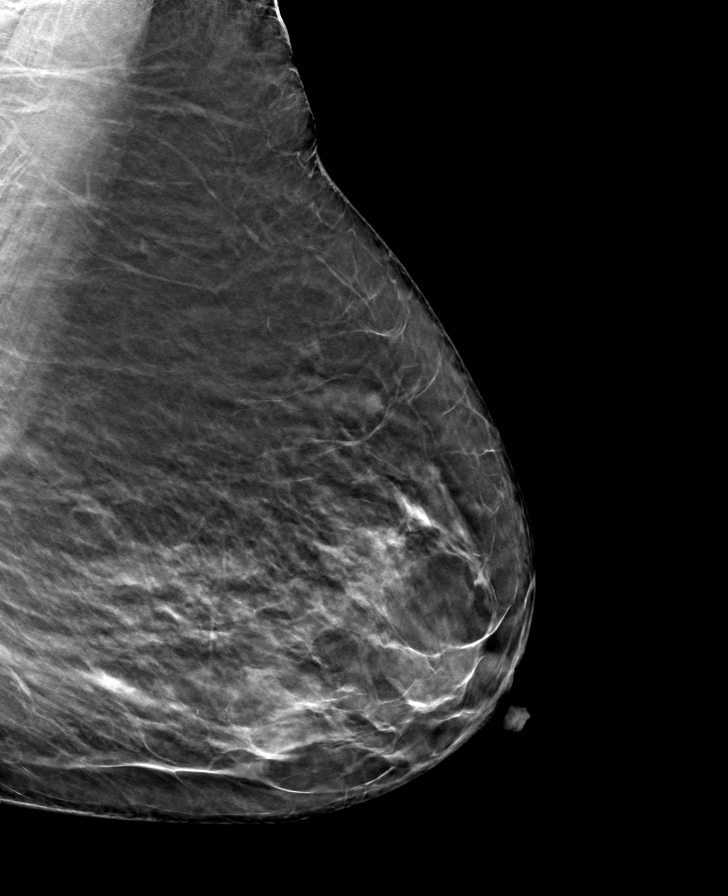

[R MLO tomo · tomo slice 35/68.0]
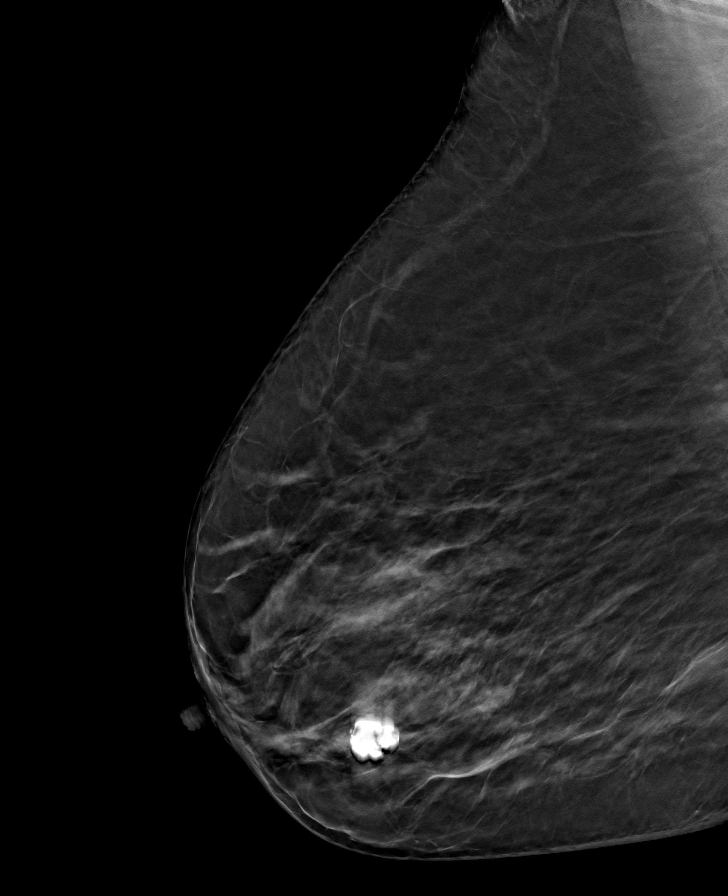

[8 of 24 positions shown; findings below may reference images not displayed]

ACR Breast Density Category c: The breast tissue is heterogeneously
dense, which may obscure small masses.
FINDINGS: There are no findings suspicious for malignancy. The images were
evaluated with computer-aided detection.
IMPRESSION: No mammographic evidence of malignancy. A result letter of this
screening mammogram will be mailed directly to the patient.

RECOMMENDATION:
Screening mammogram in one year. (Code:T4-5-GWO)

BI-RADS CATEGORY  1: Negative.

## 2022-10-19 ENCOUNTER — Ambulatory Visit (INDEPENDENT_AMBULATORY_CARE_PROVIDER_SITE_OTHER): Payer: Medicare HMO | Admitting: Nurse Practitioner

## 2022-10-19 ENCOUNTER — Encounter: Payer: Self-pay | Admitting: Nurse Practitioner

## 2022-10-19 VITALS — BP 134/77 | HR 53 | Temp 97.2°F | Resp 20 | Ht 61.0 in | Wt 162.0 lb

## 2022-10-19 DIAGNOSIS — Z6833 Body mass index (BMI) 33.0-33.9, adult: Secondary | ICD-10-CM | POA: Diagnosis not present

## 2022-10-19 DIAGNOSIS — E785 Hyperlipidemia, unspecified: Secondary | ICD-10-CM | POA: Diagnosis not present

## 2022-10-19 DIAGNOSIS — M8588 Other specified disorders of bone density and structure, other site: Secondary | ICD-10-CM

## 2022-10-19 DIAGNOSIS — I10 Essential (primary) hypertension: Secondary | ICD-10-CM | POA: Diagnosis not present

## 2022-10-19 DIAGNOSIS — Z0000 Encounter for general adult medical examination without abnormal findings: Secondary | ICD-10-CM

## 2022-10-19 DIAGNOSIS — Z0001 Encounter for general adult medical examination with abnormal findings: Secondary | ICD-10-CM

## 2022-10-19 DIAGNOSIS — E559 Vitamin D deficiency, unspecified: Secondary | ICD-10-CM | POA: Diagnosis not present

## 2022-10-19 DIAGNOSIS — E210 Primary hyperparathyroidism: Secondary | ICD-10-CM

## 2022-10-19 MED ORDER — SIMVASTATIN 20 MG PO TABS: 20.0000 mg | ORAL_TABLET | Freq: Every day | ORAL | 1 refills | Status: DC

## 2022-10-19 MED ORDER — LISINOPRIL 20 MG PO TABS: 20.0000 mg | ORAL_TABLET | Freq: Every day | ORAL | 1 refills | Status: DC

## 2022-10-19 MED ORDER — AMLODIPINE BESYLATE 2.5 MG PO TABS: 2.5000 mg | ORAL_TABLET | Freq: Every day | ORAL | 1 refills | Status: DC

## 2022-10-19 NOTE — Progress Notes (Addendum)
Subjective:    Patient ID: Mariah Fletcher, female    DOB: May 17, 1935, 88 y.o.   MRN: [id number]   Chief Complaint: annual physical   HPI:  Mariah Fletcher is a 88 y.o. who identifies as a female who was assigned female at birth.   Social history: Lives with: by herself- family checks on her daily Work history: retired from [employer] in today for follow up of the following chronic medical issues:  1. Annual physical exam No pap  2. ESSENTIAL HYPERTENSION, BENIGN No c/o chest pain sob or headache. Does not check bloodpressure at [place]. BP Readings from Last 3 Encounters:  04/20/22 (!) 147/79  01/20/22 (!) 181/91  01/18/22 (!) 169/81     3. Hyperlipidemia with target LDL less than 100 Does not watch diet and does no dedicated exercise.' Lab Results  Component Value Date   CHOL 130 04/20/2022   HDL 62 04/20/2022   LDLCALC 51 04/20/2022   TRIG 91 04/20/2022   CHOLHDL 2.1 04/20/2022     4. Hyperparathyroidism, primary [facility]) Lab Results  Component Value Date   CALCIUM 10.9 (H) 04/20/2022     5. Osteopenia of lumbar spine Last dexascan was done on 07/21/21. Her t score was -1.8.   6. Vitamin D deficiency no recent weight changes  7. BMI 33.0-33.9,adult No recent weight changes Wt Readings from Last 3 Encounters:  10/19/22 162 lb (73.5 kg)  04/20/22 161 lb 2 oz (73.1 kg)  01/20/22 163 lb (73.9 kg)   BMI Readings from Last 3 Encounters:  10/19/22 30.61 kg/m  04/20/22 30.44 kg/m  01/20/22 30.80 kg/m      New complaints: None today  No Known Allergies Outpatient Encounter Medications as of 10/19/2022  Medication Sig   amLODipine (NORVASC) 2.5 MG tablet Take 1 tablet (2.5 mg total) by mouth daily.   aspirin 81 MG tablet Take 81 mg by mouth daily.   cholecalciferol (VITAMIN D) 1000 UNITS tablet Take 1 tablet (1,000 Units total) by mouth daily.   lisinopril (ZESTRIL) 20 MG tablet Take 1 tablet (20 mg total) by mouth daily.   simvastatin  (ZOCOR) 20 MG tablet Take 1 tablet (20 mg total) by mouth daily at 6 PM. (Needs to be seen before next refill)   No facility-administered encounter medications on file as of 10/19/2022.    Past Surgical History:  Procedure Laterality Date   ABDOMINAL HYSTERECTOMY  07/26/2005   BREAST BIOPSY     unsure of side date or location    Family History  Problem Relation Age of Onset   Hyperparathyroidism Other        neg fam hx   Hypertension Mother    Arthritis Mother    Heart disease Mother    Alcohol abuse Father    Hypertension Sister    Arthritis Sister    Hypertension Brother    Arthritis Brother    Hypertension Sister    Arthritis Sister       Controlled substance contract: n/a     Review of Systems  Constitutional:  Negative for diaphoresis.  Eyes:  Negative for pain.  Respiratory:  Negative for shortness of breath.   Cardiovascular:  Negative for chest pain, palpitations and leg swelling.  Gastrointestinal:  Negative for abdominal pain.  Endocrine: Negative for polydipsia.  Skin:  Negative for rash.  Neurological:  Negative for dizziness, weakness and headaches.  Hematological:  Does not bruise/bleed easily.  All other systems reviewed and are negative.  Objective:   Physical Exam Vitals and nursing note reviewed.  Constitutional:      General: She is not in acute distress.    Appearance: Normal appearance. She is well-developed.  HENT:     Head: Normocephalic.     Right Ear: Tympanic membrane normal.     Left Ear: Tympanic membrane normal.     Nose: Nose normal.     Mouth/Throat:     Mouth: Mucous membranes are moist.  Eyes:     Pupils: Pupils are equal, round, and reactive to light.  Neck:     Vascular: No carotid bruit or JVD.  Cardiovascular:     Rate and Rhythm: Normal rate and regular rhythm.     Heart sounds: Murmur (3/6 systolic) heard.  Pulmonary:     Effort: Pulmonary effort is normal. No respiratory distress.     Breath sounds:  Normal breath sounds. No wheezing or rales.  Chest:     Chest wall: No tenderness.  Abdominal:     General: Bowel sounds are normal. There is no distension or abdominal bruit.     Palpations: Abdomen is soft. There is no hepatomegaly, splenomegaly, mass or pulsatile mass.     Tenderness: There is no abdominal tenderness.  Musculoskeletal:        General: Normal range of motion.     Cervical back: Normal range of motion and neck supple.  Lymphadenopathy:     Cervical: No cervical adenopathy.  Skin:    General: Skin is warm and dry.     Comments: 4cm sebaceous cyst right posterior shoulder  Neurological:     Mental Status: She is alert and oriented to person, place, and time.     Deep Tendon Reflexes: Reflexes are normal and symmetric.  Psychiatric:        Behavior: Behavior normal.        Thought Content: Thought content normal.        Judgment: Judgment normal.    BP 134/77   Pulse (!) 53   Temp (!) 97.2 F (36.2 C) (Temporal)   Resp 20   Ht 5\' 1"  (1.549 m)   Wt 162 lb (73.5 kg)   SpO2 98%   BMI 30.61 kg/m         Assessment & Plan:  Jalexy Guck comes in today with chief complaint of Medical Management of Chronic Issues   Diagnosis and orders addressed:  1. Annual physical exam   2. ESSENTIAL HYPERTENSION, BENIGN Low sodium diet - amLODipine (NORVASC) 2.5 MG tablet; Take 1 tablet (2.5 mg total) by mouth daily.  Dispense: 90 tablet; Refill: 1 - lisinopril (ZESTRIL) 20 MG tablet; Take 1 tablet (20 mg total) by mouth daily.  Dispense: 90 tablet; Refill: 1 - CBC with Differential/Platelet - CMP14+EGFR  3. Hyperlipidemia with target LDL less than 100 Low fat diet - simvastatin (ZOCOR) 20 MG tablet; Take 1 tablet (20 mg total) by mouth daily at 6 PM. (Needs to be seen before next refill)  Dispense: 90 tablet; Refill: 1 - Lipid panel  4. Hyperparathyroidism, primary ([facility]) Labs pending Avoid taking extra calcium  5. Osteopenia of lumbar spine Weight bearing  exercises encouraged  6. Vitamin D deficiency Continue vitamin d supplement - VITAMIN D 25 Hydroxy (Vit-D Deficiency, Fractures)  7. BMI 33.0-33.9,adult Discussed diet and exercise for person with BMI >25 Will recheck weight in 3-6 months    Labs pending Health Maintenance reviewed Diet and exercise encouraged  Follow up plan: 6 months  Mary-Margaret Steffi Noviello, FNP   

## 2022-10-20 LAB — CBC WITH DIFFERENTIAL/PLATELET
Basophils Absolute: 0.1 10*3/uL (ref 0.0–0.2)
Basos: 1 %
EOS (ABSOLUTE): 0.1 10*3/uL (ref 0.0–0.4)
Eos: 2 %
Hematocrit: 35.3 % (ref 34.0–46.6)
Hemoglobin: 11.5 g/dL (ref 11.1–15.9)
Immature Grans (Abs): 0 10*3/uL (ref 0.0–0.1)
Immature Granulocytes: 0 %
Lymphocytes Absolute: 2.2 10*3/uL (ref 0.7–3.1)
Lymphs: 36 %
MCH: 29.3 pg (ref 26.6–33.0)
MCHC: 32.6 g/dL (ref 31.5–35.7)
MCV: 90 fL (ref 79–97)
Monocytes Absolute: 0.5 10*3/uL (ref 0.1–0.9)
Monocytes: 8 %
Neutrophils Absolute: 3.2 10*3/uL (ref 1.4–7.0)
Neutrophils: 53 %
Platelets: 186 10*3/uL (ref 150–450)
RBC: 3.92 x10E6/uL (ref 3.77–5.28)
RDW: 12.8 % (ref 11.7–15.4)
WBC: 5.9 10*3/uL (ref 3.4–10.8)

## 2022-10-20 LAB — LIPID PANEL
Chol/HDL Ratio: 2.2 ratio (ref 0.0–4.4)
Cholesterol, Total: 135 mg/dL (ref 100–199)
HDL: 62 mg/dL (ref >39)
LDL Chol Calc (NIH): 50 mg/dL (ref 0–99)
Triglycerides: 134 mg/dL (ref 0–149)
VLDL Cholesterol Cal: 23 mg/dL (ref 5–40)

## 2022-10-20 LAB — CMP14+EGFR
ALT: 11 IU/L (ref 0–32)
AST: 9 IU/L (ref 0–40)
Albumin/Globulin Ratio: 1.2 (ref 1.2–2.2)
Albumin: 3.8 g/dL (ref 3.7–4.7)
Alkaline Phosphatase: 76 IU/L (ref 44–121)
BUN/Creatinine Ratio: 13 (ref 12–28)
BUN: 9 mg/dL (ref 8–27)
Bilirubin Total: 0.6 mg/dL (ref 0.0–1.2)
CO2: 24 mmol/L (ref 20–29)
Calcium: 10.9 mg/dL — ABNORMAL HIGH (ref 8.7–10.3)
Chloride: 105 mmol/L (ref 96–106)
Creatinine, Ser: 0.72 mg/dL (ref 0.57–1.00)
Globulin, Total: 3.1 g/dL (ref 1.5–4.5)
Glucose: 95 mg/dL (ref 70–99)
Potassium: 4.4 mmol/L (ref 3.5–5.2)
Sodium: 141 mmol/L (ref 134–144)
Total Protein: 6.9 g/dL (ref 6.0–8.5)
eGFR: 82 mL/min/{1.73_m2} (ref >59)

## 2022-10-20 LAB — VITAMIN D 25 HYDROXY (VIT D DEFICIENCY, FRACTURES): Vit D, 25-Hydroxy: 40.4 ng/mL (ref 30.0–100.0)

## 2023-03-16 ENCOUNTER — Other Ambulatory Visit: Payer: Self-pay | Admitting: Nurse Practitioner

## 2023-03-16 DIAGNOSIS — E785 Hyperlipidemia, unspecified: Secondary | ICD-10-CM

## 2023-04-15 ENCOUNTER — Other Ambulatory Visit: Payer: Self-pay | Admitting: Nurse Practitioner

## 2023-04-15 DIAGNOSIS — I10 Essential (primary) hypertension: Secondary | ICD-10-CM

## 2023-04-22 ENCOUNTER — Ambulatory Visit: Payer: Medicare HMO | Admitting: Nurse Practitioner

## 2023-05-06 ENCOUNTER — Other Ambulatory Visit: Payer: Self-pay | Admitting: Nurse Practitioner

## 2023-05-06 DIAGNOSIS — I10 Essential (primary) hypertension: Secondary | ICD-10-CM

## 2023-05-20 ENCOUNTER — Other Ambulatory Visit: Payer: Medicare PPO

## 2023-05-20 ENCOUNTER — Ambulatory Visit (INDEPENDENT_AMBULATORY_CARE_PROVIDER_SITE_OTHER): Payer: Medicare PPO | Admitting: Nurse Practitioner

## 2023-05-20 ENCOUNTER — Encounter: Payer: Self-pay | Admitting: Nurse Practitioner

## 2023-05-20 VITALS — BP 128/76 | HR 51 | Temp 98.0°F | Resp 20 | Ht 61.0 in | Wt 160.0 lb

## 2023-05-20 DIAGNOSIS — E785 Hyperlipidemia, unspecified: Secondary | ICD-10-CM

## 2023-05-20 DIAGNOSIS — E559 Vitamin D deficiency, unspecified: Secondary | ICD-10-CM

## 2023-05-20 DIAGNOSIS — R001 Bradycardia, unspecified: Secondary | ICD-10-CM

## 2023-05-20 DIAGNOSIS — Z6833 Body mass index (BMI) 33.0-33.9, adult: Secondary | ICD-10-CM | POA: Diagnosis not present

## 2023-05-20 DIAGNOSIS — I10 Essential (primary) hypertension: Secondary | ICD-10-CM | POA: Diagnosis not present

## 2023-05-20 DIAGNOSIS — E210 Primary hyperparathyroidism: Secondary | ICD-10-CM

## 2023-05-20 DIAGNOSIS — M8588 Other specified disorders of bone density and structure, other site: Secondary | ICD-10-CM | POA: Diagnosis not present

## 2023-05-20 MED ORDER — LISINOPRIL 20 MG PO TABS: 20.0000 mg | ORAL_TABLET | Freq: Every day | ORAL | 1 refills | Status: DC

## 2023-05-20 MED ORDER — SIMVASTATIN 20 MG PO TABS: 20.0000 mg | ORAL_TABLET | Freq: Every day | ORAL | 1 refills | Status: DC

## 2023-05-20 MED ORDER — AMLODIPINE BESYLATE 2.5 MG PO TABS: 2.5000 mg | ORAL_TABLET | Freq: Every day | ORAL | 1 refills | Status: DC

## 2023-05-20 NOTE — Progress Notes (Signed)
Subjective:    Patient ID: Mariah Fletcher, female    DOB: 12-Aug-1934, 88 y.o.   MRN: [id number]   Chief Complaint: Medical Management of Chronic Issues    HPI:  Mariah Fletcher is a 88 y.o. who identifies as a female who was assigned female at birth.   Social history: Lives with: by herself Work history: retired   Water engineer in today for follow up of the following chronic medical issues:  1. ESSENTIAL HYPERTENSION, BENIGN No c/o chest pain, sob or headache. Does not check blood pressure at home. BP Readings from Last 3 Encounters:  05/20/23 128/76  10/19/22 134/77  04/20/22 (!) 147/79    2. Hyperparathyroidism, primary ([facility]) No issues that aware of Lab Results  Component Value Date   PTH 63 01/18/2022   PTH Comment 01/18/2022   CALCIUM 10.9 (H) 10/19/2022     3. Hyperlipidemia with target LDL less than 100 Does try to watch diet but does no dedicated exercise. She does try to stay as active as she can Lab Results  Component Value Date   CHOL 135 10/19/2022   HDL 62 10/19/2022   LDLCALC 50 10/19/2022   TRIG 134 10/19/2022   CHOLHDL 2.2 10/19/2022    4. Vitamin D deficiency Daily vitamin d supplement  5. Bradycardia No syncopal episodes or near syncopal episodes  6. Osteopenia of lumbar spine Does no weight bearing exercises. Last dexascan was done on 07/16/21. Time to repeat  7. BMI 33.0-33.9,adult No recent weight changes Wt Readings from Last 3 Encounters:  05/20/23 160 lb (72.6 kg)  10/19/22 162 lb (73.5 kg)  04/20/22 161 lb 2 oz (73.1 kg)   BMI Readings from Last 3 Encounters:  05/20/23 30.23 kg/m  10/19/22 30.61 kg/m  04/20/22 30.44 kg/m      New complaints: None  today  No Known Allergies Outpatient Encounter Medications as of 05/20/2023  Medication Sig   amLODipine (NORVASC) 2.5 MG tablet TAKE 1 TABLET BY MOUTH EVERY DAY   aspirin 81 MG tablet Take 81 mg by mouth daily.   cholecalciferol (VITAMIN D) 1000 UNITS tablet Take 1 tablet (1,000  Units total) by mouth daily.   lisinopril (ZESTRIL) 20 MG tablet TAKE 1 TABLET BY MOUTH EVERY DAY   simvastatin (ZOCOR) 20 MG tablet TAKE 1 TABLET (20 MG TOTAL) BY MOUTH DAILY AT 6 PM. (NEEDS TO BE SEEN BEFORE NEXT REFILL)   No facility-administered encounter medications on file as of 05/20/2023.    Past Surgical History:  Procedure Laterality Date   ABDOMINAL HYSTERECTOMY  07/26/2005   BREAST BIOPSY     unsure of side date or location    Family History  Problem Relation Age of Onset   Hyperparathyroidism Other        neg fam hx   Hypertension Mother    Arthritis Mother    Heart disease Mother    Alcohol abuse Father    Hypertension Sister    Arthritis Sister    Hypertension Brother    Arthritis Brother    Hypertension Sister    Arthritis Sister       Controlled substance contract: n/a     Review of Systems  Constitutional:  Negative for diaphoresis.  Eyes:  Negative for pain.  Respiratory:  Negative for shortness of breath.   Cardiovascular:  Negative for chest pain, palpitations and leg swelling.  Gastrointestinal:  Negative for abdominal pain.  Endocrine: Negative for polydipsia.  Skin:  Negative for rash.  Neurological:  Negative for  dizziness, weakness and headaches.  Hematological:  Does not bruise/bleed easily.  All other systems reviewed and are negative.      Objective:   Physical Exam Vitals and nursing note reviewed.  Constitutional:      General: She is not in acute distress.    Appearance: Normal appearance. She is well-developed.  HENT:     Head: Normocephalic.     Right Ear: Tympanic membrane normal.     Left Ear: Tympanic membrane normal.     Nose: Nose normal.     Mouth/Throat:     Mouth: Mucous membranes are moist.  Eyes:     Pupils: Pupils are equal, round, and reactive to light.  Neck:     Vascular: No carotid bruit or JVD.  Cardiovascular:     Rate and Rhythm: Normal rate and regular rhythm.     Heart sounds: Murmur (2/6  systolic) heard.  Pulmonary:     Effort: Pulmonary effort is normal. No respiratory distress.     Breath sounds: Normal breath sounds. No wheezing or rales.  Chest:     Chest wall: No tenderness.  Abdominal:     General: Bowel sounds are normal. There is no distension or abdominal bruit.     Palpations: Abdomen is soft. There is no hepatomegaly, splenomegaly, mass or pulsatile mass.     Tenderness: There is no abdominal tenderness.  Musculoskeletal:        General: Normal range of motion.     Cervical back: Normal range of motion and neck supple.  Lymphadenopathy:     Cervical: No cervical adenopathy.  Skin:    General: Skin is warm and dry.     Comments: 3cm cyst on right shoulder blade  Neurological:     Mental Status: She is alert and oriented to person, place, and time.     Deep Tendon Reflexes: Reflexes are normal and symmetric.  Psychiatric:        Behavior: Behavior normal.        Thought Content: Thought content normal.        Judgment: Judgment normal.    BP 128/76   Pulse (!) 51   Temp 98 F (36.7 C) (Temporal)   Resp 20   Ht 5\' 1"  (1.549 m)   Wt 160 lb (72.6 kg)   SpO2 98%   BMI 30.23 kg/m         Assessment & Plan:  Ludie Bruhl comes in today with chief complaint of Medical Management of Chronic Issues   Diagnosis and orders addressed:  1. ESSENTIAL HYPERTENSION, BENIGN Low sodium diet - amLODipine (NORVASC) 2.5 MG tablet; Take 1 tablet (2.5 mg total) by mouth daily.  Dispense: 90 tablet; Refill: 1 - lisinopril (ZESTRIL) 20 MG tablet; Take 1 tablet (20 mg total) by mouth daily.  Dispense: 90 tablet; Refill: 1 - CBC with Differential/Platelet - CMP14+EGFR  2. Hyperparathyroidism, primary (HCC) Labs pending - PTH, Intact and Calcium  3. Hyperlipidemia with target LDL less than 100 Low fat diet - simvastatin (ZOCOR) 20 MG tablet; Take 1 tablet (20 mg total) by mouth daily at 6 PM. (Needs to be seen before next refill)  Dispense: 90 tablet; Refill:  1 - Lipid panel  4. Vitamin D deficiency Continue daily vitamin d supplement  5. Bradycardia  6. Osteopenia of lumbar spine Bone density to be repeated today  7. BMI 33.0-33.9,adult Discussed diet and exercise for person with BMI >25 Will recheck weight in 3-6 months  Labs pending Health Maintenance reviewed Diet and exercise encouraged  Follow up plan: 6 months   Mary-Margaret Daphine Deutscher, FNP

## 2023-05-20 NOTE — Patient Instructions (Signed)
Bone Health Bones protect organs, store calcium, anchor muscles, and support the whole body. Keeping your bones strong is important, especially as you get older. You can take actions to help keep your bones strong and healthy. Why is keeping my bones healthy important?  Keeping your bones healthy is important because your body constantly replaces bone cells. Cells get old, and new cells take their place. As we age, we lose bone cells because the body may not be able to make enough new cells to replace the old cells. The amount of bone cells and bone tissue you have is referred to as bone mass. The higher your bone mass, the stronger your bones. The aging process leads to an overall loss of bone mass in the body, which can increase the likelihood of: Broken bones. A condition in which the bones become weak and brittle (osteoporosis). A large decline in bone mass occurs in older adults. In women, it occurs about the time of menopause. What actions can I take to keep my bones healthy? Good health habits are important for maintaining healthy bones. This includes eating nutritious foods and exercising regularly. To have healthy bones, you need to get enough of the right minerals and vitamins. Most nutrition experts recommend getting these nutrients from the foods that you eat. In some cases, taking supplements may also be recommended. Doing certain types of exercise is also important for bone health. What are the nutritional recommendations for healthy bones?  Eating a well-balanced diet with plenty of calcium and vitamin D will help to protect your bones. Nutritional recommendations vary from person to person. Ask your health care provider what is healthy for you. Here are some general guidelines. Get enough calcium Calcium is the most important (essential) mineral for bone health. Most people can get enough calcium from their diet, but supplements may be recommended for people who are at risk for  osteoporosis. Good sources of calcium include: Dairy products, such as low-fat or nonfat milk, cheese, and yogurt. Dark green leafy vegetables, such as bok choy and broccoli. Foods that have calcium added to them (are fortified). Foods that may be fortified with calcium include orange juice, cereal, bread, soy beverages, and tofu products. Nuts, such as almonds. Follow these recommended amounts for daily calcium intake: Infants, 0-6 months: 200 mg. Infants, 6-12 months: 260 mg. Children, age 1-3: 700 mg. Children, age 4-8: 1,000 mg. Children, age 9-13: 1,300 mg. Teens, age 14-18: 1,300 mg. Adults, age 19-50: 1,000 mg. Adults, age 51-70: Men: 1,000 mg. Women: 1,200 mg. Adults, age 71 or older: 1,200 mg. Pregnant and breastfeeding females: Teens: 1,300 mg. Adults: 1,000 mg. Get enough vitamin D Vitamin D is the most essential vitamin for bone health. It helps the body absorb calcium. Sunlight stimulates the skin to make vitamin D, so be sure to get enough sunlight. If you live in a cold climate or you do not get outside often, your health care provider may recommend that you take vitamin D supplements. Good sources of vitamin D in your diet include: Egg yolks. Saltwater fish. Milk and cereal fortified with vitamin D. Follow these recommended amounts for daily vitamin D intake: Infants, 0-12 months: 400 international units (IU). Children and teens, age 1-18: 600 international units. Adults, age 59 or younger: 600 international units. Adults, age 60 or older: 600-1,000 international units. Get other important nutrients Other nutrients that are important for bone health include: Phosphorus. This mineral is found in meat, poultry, dairy foods, nuts, and legumes. The   recommended daily intake for adult men and adult women is 700 mg. Magnesium. This mineral is found in seeds, nuts, dark green vegetables, and legumes. The recommended daily intake for adult men is 400-420 mg. For adult women,  it is 310-320 mg. Vitamin K. This vitamin is found in green leafy vegetables. The recommended daily intake is 120 mcg for adult men and 90 mcg for adult women. What type of physical activity is best for building and maintaining healthy bones? Weight-bearing and strength-building activities are important for building and maintaining healthy bones. Weight-bearing activities cause muscles and bones to work against gravity. Strength-building activities increase the strength of the muscles that support bones. Weight-bearing and muscle-building activities include: Walking and hiking. Jogging and running. Dancing. Gym exercises. Lifting weights. Tennis and racquetball. Climbing stairs. Aerobics. Adults should get at least 30 minutes of moderate physical activity on most days. Children should get at least 60 minutes of moderate physical activity on most days. Ask your health care provider what type of exercise is best for you. How can I find out if my bone mass is low? Bone mass can be measured with an X-ray test called a bone mineral density (BMD) test. This test is recommended for all women who are age 65 or older. It may also be recommended for: Men who are age 70 or older. People who are at risk for osteoporosis because of: Having a long-term disease that weakens bones, such as kidney disease or rheumatoid arthritis. Having menopause earlier than normal. Taking medicine that weakens bones, such as steroids, thyroid hormones, or hormone treatment for breast cancer or prostate cancer. Smoking. Drinking three or more alcoholic drinks a day. Being underweight. Sedentary lifestyle. If you find that you have a low bone mass, you may be able to prevent osteoporosis or further bone loss by changing your diet and lifestyle. Where can I find more information? Bone Health & Osteoporosis Foundation: www.nof.org/patients National Institutes of Health: www.bones.nih.gov International Osteoporosis  Foundation: www.iofbonehealth.org Summary The aging process leads to an overall loss of bone mass in the body, which can increase the likelihood of broken bones and osteoporosis. Eating a well-balanced diet with plenty of calcium and vitamin D will help to protect your bones. Weight-bearing and strength-building activities are also important for building and maintaining strong bones. Bone mass can be measured with an X-ray test called a bone mineral density (BMD) test. This information is not intended to replace advice given to you by your health care provider. Make sure you discuss any questions you have with your health care provider. Document Revised: 12/24/2020 Document Reviewed: 12/24/2020 Elsevier Patient Education  2024 [employer]  

## 2023-05-21 LAB — CBC WITH DIFFERENTIAL/PLATELET
Basophils Absolute: 0.1 10*3/uL (ref 0.0–0.2)
Basos: 1 %
EOS (ABSOLUTE): 0.1 10*3/uL (ref 0.0–0.4)
Eos: 1 %
Hematocrit: 36.8 % (ref 34.0–46.6)
Hemoglobin: 11.8 g/dL (ref 11.1–15.9)
Immature Grans (Abs): 0 10*3/uL (ref 0.0–0.1)
Immature Granulocytes: 0 %
Lymphocytes Absolute: 2.8 10*3/uL (ref 0.7–3.1)
Lymphs: 51 %
MCH: 29.2 pg (ref 26.6–33.0)
MCHC: 32.1 g/dL (ref 31.5–35.7)
MCV: 91 fL (ref 79–97)
Monocytes Absolute: 0.3 10*3/uL (ref 0.1–0.9)
Monocytes: 5 %
Neutrophils Absolute: 2.4 10*3/uL (ref 1.4–7.0)
Neutrophils: 42 %
Platelets: 188 10*3/uL (ref 150–450)
RBC: 4.04 x10E6/uL (ref 3.77–5.28)
RDW: 13 % (ref 11.7–15.4)
WBC: 5.6 10*3/uL (ref 3.4–10.8)

## 2023-05-21 LAB — LIPID PANEL
Chol/HDL Ratio: 2.3 ratio (ref 0.0–4.4)
Cholesterol, Total: 123 mg/dL (ref 100–199)
HDL: 54 mg/dL (ref >39)
LDL Chol Calc (NIH): 48 mg/dL (ref 0–99)
Triglycerides: 116 mg/dL (ref 0–149)
VLDL Cholesterol Cal: 21 mg/dL (ref 5–40)

## 2023-05-21 LAB — CMP14+EGFR
ALT: 10 [IU]/L (ref 0–32)
AST: 13 [IU]/L (ref 0–40)
Albumin: 4 g/dL (ref 3.7–4.7)
Alkaline Phosphatase: 63 [IU]/L (ref 44–121)
BUN/Creatinine Ratio: 15 (ref 12–28)
BUN: 11 mg/dL (ref 8–27)
Bilirubin Total: 0.6 mg/dL (ref 0.0–1.2)
CO2: 25 mmol/L (ref 20–29)
Calcium: 10.6 mg/dL — ABNORMAL HIGH (ref 8.7–10.3)
Chloride: 105 mmol/L (ref 96–106)
Creatinine, Ser: 0.71 mg/dL (ref 0.57–1.00)
Globulin, Total: 3.1 g/dL (ref 1.5–4.5)
Glucose: 83 mg/dL (ref 70–99)
Potassium: 4.6 mmol/L (ref 3.5–5.2)
Sodium: 142 mmol/L (ref 134–144)
Total Protein: 7.1 g/dL (ref 6.0–8.5)
eGFR: 83 mL/min/{1.73_m2} (ref >59)

## 2023-05-21 LAB — PTH, INTACT AND CALCIUM: PTH: 84 pg/mL — ABNORMAL HIGH (ref 15–65)

## 2023-05-30 ENCOUNTER — Ambulatory Visit (INDEPENDENT_AMBULATORY_CARE_PROVIDER_SITE_OTHER): Payer: Medicare PPO

## 2023-05-30 VITALS — Ht 62.0 in | Wt 160.0 lb

## 2023-05-30 DIAGNOSIS — Z0000 Encounter for general adult medical examination without abnormal findings: Secondary | ICD-10-CM | POA: Diagnosis not present

## 2023-05-30 NOTE — Progress Notes (Signed)
Subjective:   Mariah Fletcher is a 88 y.o. female who presents for Medicare Annual (Subsequent) preventive examination.  Visit Complete: Virtual I connected with  [name] on 05/30/23 by a audio enabled telemedicine application and verified that I am speaking with the correct person using two identifiers.  Patient Location: Home  Provider Location: Home Office  I discussed the limitations of evaluation and management by telemedicine. The patient expressed understanding and agreed to proceed.  Vital Signs: Because this visit was a virtual/telehealth visit, some criteria may be missing or patient reported. Any vitals not documented were not able to be obtained and vitals that have been documented are patient reported.  Patient Medicare AWV questionnaire was completed by the patient on 05/30/2023; I have confirmed that all information answered by patient is correct and no changes since this date.  Cardiac Risk Factors include: advanced age (>52men, >52 women);dyslipidemia;hypertension     Objective:    Today's Vitals   05/30/23 1437  Weight: 160 lb (72.6 kg)  Height: 5\' 2"  (1.575 m)   Body mass index is 29.26 kg/m.     05/30/2023    2:40 PM 05/26/2022   10:13 AM 05/25/2021    9:51 AM 07/12/2019    1:33 PM 06/06/2018   10:21 AM 02/24/2015   10:08 AM  Advanced Directives  Does Patient Have a Medical Advance Directive? No No No Yes Yes No  Type of Advance Directive    Living will Living will;Healthcare Power of Attorney   Does patient want to make changes to medical advance directive?    No - Patient declined No - Patient declined   Copy of Healthcare Power of Attorney in Chart?     No - copy requested   Would patient like information on creating a medical advance directive? Yes (MAU/Ambulatory/Procedural Areas - Information given) No - Patient declined No - Patient declined   Yes - Educational materials given    Current Medications (verified) Outpatient Encounter Medications as  of 05/30/2023  Medication Sig   amLODipine (NORVASC) 2.5 MG tablet Take 1 tablet (2.5 mg total) by mouth daily.   aspirin 81 MG tablet Take 81 mg by mouth daily.   cholecalciferol (VITAMIN D) 1000 UNITS tablet Take 1 tablet (1,000 Units total) by mouth daily.   lisinopril (ZESTRIL) 20 MG tablet Take 1 tablet (20 mg total) by mouth daily.   simvastatin (ZOCOR) 20 MG tablet Take 1 tablet (20 mg total) by mouth daily at 6 PM. (Needs to be seen before next refill)   No facility-administered encounter medications on file as of 05/30/2023.    Allergies (verified) Patient has no known allergies.   History: Past Medical History:  Diagnosis Date   Allergy    Hyperlipidemia    Hypertension    Past Surgical History:  Procedure Laterality Date   ABDOMINAL HYSTERECTOMY  07/26/2005   BREAST BIOPSY     unsure of side date or location   Family History  Problem Relation Age of Onset   Hyperparathyroidism Other        neg fam hx   Hypertension Mother    Arthritis Mother    Heart disease Mother    Alcohol abuse Father    Hypertension Sister    Arthritis Sister    Hypertension Brother    Arthritis Brother    Hypertension Sister    Arthritis Sister    Social History   Socioeconomic History   Marital status: Single    Spouse name: Not  on file   Number of children: 2   Years of education: 12   Highest education level: Not on file  Occupational History   Occupation: retired  Tobacco Use   Smoking status: Never   Smokeless tobacco: Never  Vaping Use   Vaping status: Never Used  Substance and Sexual Activity   Alcohol use: Yes    Comment: Rarely drinks wine    Drug use: Never   Sexual activity: Not Currently  Other Topics Concern   Not on file  Social History Narrative   2 sons   4 grandchildren   Social Determinants of Health   Financial Resource Strain: Low Risk  (05/30/2023)   Overall Financial Resource Strain (CARDIA)    Difficulty of Paying Living Expenses: Not hard at  all  Food Insecurity: No Food Insecurity (05/30/2023)   Hunger Vital Sign    Worried About Running Out of Food in the Last Year: Never true    Ran Out of Food in the Last Year: Never true  Transportation Needs: No Transportation Needs (05/30/2023)   PRAPARE - Administrator, Civil Service (Medical): No    Lack of Transportation (Non-Medical): No  Physical Activity: Insufficiently Active (05/30/2023)   Exercise Vital Sign    Days of Exercise per Week: 3 days    Minutes of Exercise per Session: 30 min  Stress: No Stress Concern Present (05/30/2023)   [employer] of Occupational Health - Occupational Stress Questionnaire    Feeling of Stress : Not at all  Social Connections: Moderately Isolated (05/30/2023)   Social Connection and Isolation Panel [NHANES]    Frequency of Communication with Friends and Family: More than three times a week    Frequency of Social Gatherings with Friends and Family: More than three times a week    Attends Religious Services: More than 4 times per year    Active Member of [employer] or Organizations: No    Attends Engineer, structural: Never    Marital Status: Never married    Tobacco Counseling Counseling given: Not Answered   Clinical Intake:  Pre-visit preparation completed: Yes  Pain : No/denies pain     Nutritional Risks: None Diabetes: No  How often do you need to have someone help you when you read instructions, pamphlets, or other written materials from your doctor or pharmacy?: 1 - Never  Interpreter Needed?: No  Information entered by :: Renie Ora, LPN   Activities of Daily Living    05/30/2023    2:40 PM  In your present state of health, do you have any difficulty performing the following activities:  Hearing? 0  Vision? 0  Difficulty concentrating or making decisions? 0  Walking or climbing stairs? 0  Dressing or bathing? 0  Doing errands, shopping? 0  Preparing Food and eating ? N  Using the Toilet? N   In the past six months, have you accidently leaked urine? N  Do you have problems with loss of bowel control? N  Managing your Medications? N  Managing your Finances? N  Housekeeping or managing your Housekeeping? N    Patient Care Team: Bennie Pierini, FNP as PCP - General (Nurse Practitioner) Rollene Rotunda, MD as PCP - Cardiology (Cardiology)  Indicate any recent Medical Services you may have received from other than [facility] providers in the past year (date may be approximate).     Assessment:   This is a routine wellness examination for Verenice.  Hearing/Vision screen Vision Screening -  Comments:: Patient cannot remember doctors name    Goals Addressed             This Visit's Progress    DIET - REDUCE SUGAR INTAKE   On track    Reduce intake of baked goods.        Depression Screen    05/30/2023    2:39 PM 05/20/2023   11:21 AM 10/19/2022   10:04 AM 05/26/2022   10:12 AM 04/20/2022   10:26 AM 01/18/2022   11:37 AM 07/16/2021   11:45 AM  PHQ 2/9 Scores  PHQ - 2 Score 0 0 0 0 0 0 0  PHQ- 9 Score 0 1 1  0 0 0    Fall Risk    05/30/2023    2:38 PM 05/20/2023   11:21 AM 10/19/2022   10:04 AM 05/26/2022   10:14 AM 04/20/2022   10:26 AM  Fall Risk   Falls in the past year? 0 0 0 0 0  Number falls in past yr: 0   0   Injury with Fall? 0   0   Risk for fall due to : No Fall Risks   Other (Comment)   Risk for fall due to: Comment    Due to age   Follow up Falls prevention discussed   Falls evaluation completed     MEDICARE RISK AT HOME: Medicare Risk at Home Any stairs in or around the home?: Yes If so, are there any without handrails?: No Home free of loose throw rugs in walkways, pet beds, electrical cords, etc?: Yes Adequate lighting in your home to reduce risk of falls?: Yes Life alert?: No Use of a cane, walker or w/c?: No Grab bars in the bathroom?: Yes Shower chair or bench in shower?: Yes Elevated toilet seat or a handicapped toilet?:  Yes  TIMED UP AND GO:  Was the test performed?  No    Cognitive Function:    06/07/2018    9:18 AM  MMSE - Mini Mental State Exam  Orientation to time 5  Orientation to Place 5  Registration 3  Attention/ Calculation 5  Recall 2  Language- name 2 objects 2  Language- repeat 1  Language- follow 3 step command 3  Language- read & follow direction 1  Write a sentence 1  Copy design 1  Total score 29        05/30/2023    2:40 PM 05/26/2022   10:19 AM 05/25/2021    9:55 AM 07/12/2019    1:36 PM  6CIT Screen  What Year? 0 points 0 points 0 points 0 points  What month? 0 points 0 points 0 points 0 points  What time? 0 points 0 points 0 points 0 points  Count back from 20 0 points 0 points 0 points 0 points  Months in reverse 2 points 0 points 0 points 0 points  Repeat phrase 2 points 0 points 0 points 2 points  Total Score 4 points 0 points 0 points 2 points    Immunizations Immunization History  Administered Date(s) Administered   Fluad Quad(high Dose 65+) 04/21/2020, 05/03/2022   Influenza Split 05/16/2013   Influenza, High Dose Seasonal PF 05/20/2014, 05/10/2015, 05/01/2018, 05/01/2019, 05/01/2019, 05/05/2021, 05/02/2023   Influenza,inj,Quad PF,6+ Mos 04/30/2016   Influenza-Unspecified 04/25/2017   Moderna Covid-19 Fall Seasonal Vaccine 63yrs & older 05/12/2023   Moderna Sars-Covid-2 Vaccination 10/05/2019, 11/02/2019   Pneumococcal Conjugate-13 11/18/2014   Pneumococcal Polysaccharide-23 06/06/2018    TDAP status: Due,  Education has been provided regarding the importance of this vaccine. Advised may receive this vaccine at local pharmacy or Health Dept. Aware to provide a copy of the vaccination record if obtained from local pharmacy or Health Dept. Verbalized acceptance and understanding.  Flu Vaccine status: Up to date  Pneumococcal vaccine status: Up to date  Covid-19 vaccine status: Completed vaccines  Qualifies for Shingles Vaccine? Yes   Zostavax  completed No   Shingrix Completed?: No.    Education has been provided regarding the importance of this vaccine. Patient has been advised to call insurance company to determine out of pocket expense if they have not yet received this vaccine. Advised may also receive vaccine at local pharmacy or Health Dept. Verbalized acceptance and understanding.  Screening Tests Health Maintenance  Topic Date Due   DTaP/Tdap/Td (1 - Tdap) Never done   Zoster Vaccines- Shingrix (1 of 2) 08/20/2023 (Originally 02/11/1954)   COVID-19 Vaccine (4 - 2023-24 season) 07/07/2023   Medicare Annual Wellness (AWV)  05/29/2024   Pneumonia Vaccine 64+ Years old  Completed   INFLUENZA VACCINE  Completed   DEXA SCAN  Completed   HPV VACCINES  Aged Out    Health Maintenance  Health Maintenance Due  Topic Date Due   DTaP/Tdap/Td (1 - Tdap) Never done    Colorectal cancer screening: No longer required.   Mammogram status: No longer required due to age.  Bone Density status: Ordered 05/20/2023. Pt provided with contact info and advised to call to schedule appt.  Lung Cancer Screening: (Low Dose CT Chest recommended if Age 41-80 years, 20 pack-year currently smoking OR have quit w/in 15years.) does not qualify.   Lung Cancer Screening Referral: n/a  Additional Screening:  Hepatitis C Screening: does not qualify;   Vision Screening: Recommended annual ophthalmology exams for early detection of glaucoma and other disorders of the eye. Is the patient up to date with their annual eye exam?  Yes  Who is the provider or what is the name of the office in which the patient attends annual eye exams? Unknown  If pt is not established with a provider, would they like to be referred to a provider to establish care? No .   Dental Screening: Recommended annual dental exams for proper oral hygiene   Community Resource Referral / Chronic Care Management: CRR required this visit?  No   CCM required this visit?  No      Plan:     I have personally reviewed and noted the following in the patient's chart:   Medical and social history Use of alcohol, tobacco or illicit drugs  Current medications and supplements including opioid prescriptions. Patient is not currently taking opioid prescriptions. Functional ability and status Nutritional status Physical activity Advanced directives List of other physicians Hospitalizations, surgeries, and ER visits in previous 12 months Vitals Screenings to include cognitive, depression, and falls Referrals and appointments  In addition, I have reviewed and discussed with patient certain preventive protocols, quality metrics, and best practice recommendations. A written personalized care plan for preventive services as well as general preventive health recommendations were provided to patient.     Lorrene Reid, LPN   16/12/628   After Visit Summary: (MyChart) Due to this being a telephonic visit, the after visit summary with patients personalized plan was offered to patient via MyChart   Nurse Notes: none

## 2023-05-30 NOTE — Patient Instructions (Signed)
Mariah Fletcher , Thank you for taking time to come for your Medicare Wellness Visit. I appreciate your ongoing commitment to your health goals. Please review the following plan we discussed and let me know if I can assist you in the future.   Referrals/Orders/Follow-Ups/Clinician Recommendations: Aim for 30 minutes of exercise or brisk walking, 6-8 glasses of water, and 5 servings of fruits and vegetables each day.   This is a list of the screening recommended for you and due dates:  Health Maintenance  Topic Date Due   DTaP/Tdap/Td vaccine (1 - Tdap) Never done   Zoster (Shingles) Vaccine (1 of 2) 08/20/2023*   COVID-19 Vaccine (4 - 2023-24 season) 07/07/2023   Medicare Annual Wellness Visit  05/29/2024   Pneumonia Vaccine  Completed   Flu Shot  Completed   DEXA scan (bone density measurement)  Completed   HPV Vaccine  Aged Out  *Topic was postponed. The date shown is not the original due date.    Advanced directives: (Provided) Advance directive discussed with you today. I have provided a copy for you to complete at home and have notarized. Once this is complete, please bring a copy in to our office so we can scan it into your chart. Information on Advanced Care Planning can be found at [facility] Advance Health Care Directives Advance Health Care Directives ([email or web address]    Next Medicare Annual Wellness Visit scheduled for next year: Yes  Insert Preventive Care attachment Insert FALL PREVENTION attachment if needed

## 2023-11-21 ENCOUNTER — Ambulatory Visit (INDEPENDENT_AMBULATORY_CARE_PROVIDER_SITE_OTHER): Payer: Medicare PPO | Admitting: Nurse Practitioner

## 2023-11-21 ENCOUNTER — Ambulatory Visit (INDEPENDENT_AMBULATORY_CARE_PROVIDER_SITE_OTHER): Payer: Medicare PPO

## 2023-11-21 ENCOUNTER — Encounter: Payer: Self-pay | Admitting: Nurse Practitioner

## 2023-11-21 VITALS — BP 139/76 | HR 50 | Temp 97.7°F | Ht 62.0 in | Wt 163.0 lb

## 2023-11-21 DIAGNOSIS — Z0000 Encounter for general adult medical examination without abnormal findings: Secondary | ICD-10-CM | POA: Diagnosis not present

## 2023-11-21 DIAGNOSIS — I10 Essential (primary) hypertension: Secondary | ICD-10-CM | POA: Diagnosis not present

## 2023-11-21 DIAGNOSIS — Z6833 Body mass index (BMI) 33.0-33.9, adult: Secondary | ICD-10-CM | POA: Diagnosis not present

## 2023-11-21 DIAGNOSIS — L2084 Intrinsic (allergic) eczema: Secondary | ICD-10-CM

## 2023-11-21 DIAGNOSIS — Z0001 Encounter for general adult medical examination with abnormal findings: Secondary | ICD-10-CM | POA: Diagnosis not present

## 2023-11-21 DIAGNOSIS — L723 Sebaceous cyst: Secondary | ICD-10-CM

## 2023-11-21 DIAGNOSIS — E559 Vitamin D deficiency, unspecified: Secondary | ICD-10-CM | POA: Diagnosis not present

## 2023-11-21 DIAGNOSIS — E785 Hyperlipidemia, unspecified: Secondary | ICD-10-CM

## 2023-11-21 DIAGNOSIS — M8588 Other specified disorders of bone density and structure, other site: Secondary | ICD-10-CM | POA: Diagnosis not present

## 2023-11-21 DIAGNOSIS — E210 Primary hyperparathyroidism: Secondary | ICD-10-CM

## 2023-11-21 MED ORDER — LISINOPRIL 20 MG PO TABS: 20.0000 mg | ORAL_TABLET | Freq: Every day | ORAL | 1 refills | Status: DC

## 2023-11-21 MED ORDER — AMLODIPINE BESYLATE 2.5 MG PO TABS: 2.5000 mg | ORAL_TABLET | Freq: Every day | ORAL | 1 refills | Status: DC

## 2023-11-21 MED ORDER — PREDNISONE 20 MG PO TABS: 40.0000 mg | ORAL_TABLET | Freq: Every day | ORAL | 0 refills | Status: AC

## 2023-11-21 MED ORDER — TRIAMCINOLONE ACETONIDE 0.1 % EX CREA: 1.0000 | TOPICAL_CREAM | Freq: Two times a day (BID) | CUTANEOUS | 0 refills | Status: AC

## 2023-11-21 MED ORDER — SIMVASTATIN 20 MG PO TABS
20.0000 mg | ORAL_TABLET | Freq: Every day | ORAL | 1 refills | Status: DC
Start: 2023-11-21 — End: 2024-05-22

## 2023-11-21 NOTE — Progress Notes (Signed)
 Subjective:    Patient ID: Mariah Fletcher, female    DOB: Dec 31, 1934, 89 y.o.   MRN: [id number]   Chief Complaint: annual physical   HPI:  Mariah Fletcher is a 89 y.o. who identifies as a female who was assigned female at birth.   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 Annual physical exam No pap  2. ESSENTIAL HYPERTENSION, BENIGN No c/o chest pain sob or headache. Does not check bloodpressure at jome. BP Readings from Last 3 Encounters:  05/20/23 128/76  10/19/22 134/77  04/20/22 (!) 147/79     3. Hyperlipidemia with target LDL less than 100 Does not watch diet and does no dedicated exercise.' Lab Results  Component Value Date   CHOL 123 05/20/2023   HDL 54 05/20/2023   LDLCALC 48 05/20/2023   TRIG 116 05/20/2023   CHOLHDL 2.3 05/20/2023     4. Hyperparathyroidism, primary [facility]) Lab Results  Component Value Date   CALCIUM 10.6 (H) 05/20/2023     5. Osteopenia of lumbar spine Last dexascan was done on 07/21/21. Her t score was -1.8.   6. Vitamin D  deficiency no recent weight changes  7. BMI 33.0-33.9,adult No recent weight changes Wt Readings from Last 3 Encounters:  05/30/23 160 lb (72.6 kg)  05/20/23 160 lb (72.6 kg)  10/19/22 162 lb (73.5 kg)   BMI Readings from Last 3 Encounters:  05/30/23 29.26 kg/m  05/20/23 30.23 kg/m  10/19/22 30.61 kg/m      New complaints: Bil hands have been itching for about a week. The skin feels thick. Denies any new soaps or detergents  No Known Allergies Outpatient Encounter Medications as of 11/21/2023  Medication Sig   amLODipine  (NORVASC ) 2.5 MG tablet Take 1 tablet (2.5 mg total) by mouth daily.   aspirin 81 MG tablet Take 81 mg by mouth daily.   cholecalciferol (VITAMIN D ) 1000 UNITS tablet Take 1 tablet (1,000 Units total) by mouth daily.   lisinopril   (ZESTRIL ) 20 MG tablet Take 1 tablet (20 mg total) by mouth daily.   simvastatin  (ZOCOR ) 20 MG tablet Take 1 tablet (20 mg total) by mouth daily at 6 PM. (Needs to be seen before next refill)   No facility-administered encounter medications on file as of 11/21/2023.    Past Surgical History:  Procedure Laterality Date   ABDOMINAL HYSTERECTOMY  07/26/2005   BREAST BIOPSY     unsure of side date or location    Family History  Problem Relation Age of Onset   Hyperparathyroidism Other        neg fam hx   Hypertension Mother    Arthritis Mother    Heart disease Mother    Alcohol abuse Father    Hypertension Sister    Arthritis Sister    Hypertension Brother    Arthritis Brother    Hypertension Sister    Arthritis Sister       Controlled substance contract: n/a     Review of Systems  Constitutional:  Negative for diaphoresis.  Eyes:  Negative for pain.  Respiratory:  Negative for shortness of breath.   Cardiovascular:  Negative for chest pain, palpitations and leg swelling.  Gastrointestinal:  Negative for abdominal pain.  Endocrine: Negative for polydipsia.  Skin:  Negative for rash.  Neurological:  Negative for dizziness, weakness and headaches.  Hematological:  Does not bruise/bleed easily.  All other systems reviewed and are negative.      Objective:   Physical Exam Vitals and nursing note reviewed.  Constitutional:      General: She is not in acute distress.    Appearance: Normal appearance. She is well-developed.  HENT:     Head: Normocephalic.     Right Ear: Tympanic membrane normal.     Left Ear: Tympanic membrane normal.     Nose: Nose normal.     Mouth/Throat:     Mouth: Mucous membranes are moist.  Eyes:     Pupils: Pupils are equal, round, and reactive to light.  Neck:     Vascular: No carotid bruit or JVD.  Cardiovascular:     Rate and Rhythm: Normal rate and regular rhythm.     Heart sounds: Murmur (3/6 systolic) heard.  Pulmonary:      Effort: Pulmonary effort is normal. No respiratory distress.     Breath sounds: Normal breath sounds. No wheezing or rales.  Chest:     Chest wall: No tenderness.  Abdominal:     General: Bowel sounds are normal. There is no distension or abdominal bruit.     Palpations: Abdomen is soft. There is no hepatomegaly, splenomegaly, mass or pulsatile mass.     Tenderness: There is no abdominal tenderness.  Musculoskeletal:        General: Normal range of motion.     Cervical back: Normal range of motion and neck supple.  Lymphadenopathy:     Cervical: No cervical adenopathy.  Skin:    General: Skin is warm and dry.     Comments: 4cm sebaceous cyst right posterior shoulder Bil hands have lichenification on dorsal surface  Neurological:     Mental Status: She is alert and oriented to person, place, and time.     Deep Tendon Reflexes: Reflexes are normal and symmetric.  Psychiatric:        Behavior: Behavior normal.        Thought Content: Thought content normal.        Judgment: Judgment normal.    BP 139/76   Pulse (!) 50   Temp 97.7 F (36.5 C) (Temporal)   Ht 5\' 2"  (1.575 m)   Wt 163 lb (73.9 kg)   SpO2 98%   BMI 29.81 kg/m          Assessment & Plan:  Joree Kinzler comes in today with chief complaint of annual physical  Diagnosis and orders addressed:  1. Annual physical exam   2. ESSENTIAL HYPERTENSION, BENIGN Low sodium diet - amLODipine  (NORVASC ) 2.5 MG tablet; Take 1 tablet (2.5 mg total) by mouth daily.  Dispense: 90 tablet; Refill: 1 - lisinopril  (ZESTRIL ) 20 MG tablet; Take 1 tablet (20 mg total) by mouth daily.  Dispense: 90 tablet; Refill: 1 - CBC with Differential/Platelet - CMP14+EGFR  3. Hyperlipidemia with target LDL less than 100 Low fat diet - simvastatin  (ZOCOR ) 20 MG tablet; Take 1 tablet (20 mg total) by mouth daily at 6 PM. (Needs to be seen before next refill)  Dispense: 90 tablet; Refill: 1 - Lipid panel  4. Hyperparathyroidism, primary  (HCC) Labs pending Avoid taking extra calcium  5. Osteopenia of lumbar spine Weight bearing exercises encouraged  6. Vitamin D  deficiency Continue vitamin d  supplement - VITAMIN D  25 Hydroxy (Vit-D Deficiency, Fractures)  7. BMI 33.0-33.9,adult Discussed diet and exercise for person with BMI >25 Will recheck weight in 3-6 months  8. Atopic  dermatitis Avoid harsh detergents or cleaning products Triamcinolone  cream mixed with lotion topically BID Prednisone 20mg  - 2 at same time daily for 5 days  9. Sebaceous cyst Will schedule removal  Labs pending Health Maintenance reviewed Diet and exercise encouraged  Follow up plan: 6 months   Mary-Margaret Gaylyn Keas, FNP

## 2023-11-21 NOTE — Patient Instructions (Signed)
 Pocket of Fluid in the Skin (Epidermoid Cyst): What to Know  An epidermoid cyst is a small lump under your skin. The cyst contains a substance that is thick and oily. What are the causes? A blocked hair follicle. A hair curls and re-enters the skin instead of growing straight out of the skin. A blocked pore. Irritated skin. An injury to the skin. Certain conditions that are passed from parent to child. Human papillomavirus (HPV). This happens rarely when cysts occur on the bottom of the feet. Long-term sun damage to the skin. What increases the risk? Having acne. Being female. Having an injury to the skin. Being past puberty. Certain conditions that are passed down through family (genetic disorder). What are the signs or symptoms? The only sign of this type of cyst may be a small, painless lump under the skin. These cysts are usually painless, but they can get infected. Symptoms of infection may include: Redness. Inflammation. Tenderness. Warmth. Fever. A bad-smelling substance that drains from the cyst. Pus that drains from the cyst. How is this treated? If a cyst becomes inflamed, treatment may include: Opening and draining the cyst. Antibiotics. Shots of medicines called steroids that help lessen inflammation. Surgery to remove the cyst if it's large, painful, or could turn into cancer. Do not try to open or squeeze a cyst yourself. Follow these instructions at home: Medicines Take your medicines only as told. If you were given antibiotics, take them as told. Do not stop taking them even if you start to feel better. General instructions Keep the area around your cyst clean and dry. Wear loose, dry clothing. Avoid touching your cyst. Check your cyst every day for signs of infection. Check for: Redness, swelling, or pain. Fluid or blood. Warmth. Pus or a bad smell. Keep all follow-up visits to make sure there's no discomfort or infection. Contact a doctor if: You have  any signs of infection. Your cyst doesn't get better or gets worse. You get a cyst that looks different from other cysts you've had. You have a fever. You have redness that spreads from the cyst. This information is not intended to replace advice given to you by your health care provider. Make sure you discuss any questions you have with your health care provider. Document Revised: 02/25/2023 Document Reviewed: 02/25/2023 Elsevier Patient Education  2024 [employer].

## 2023-11-22 LAB — THYROID PANEL WITH TSH
Free Thyroxine Index: 1.5 (ref 1.2–4.9)
T3 Uptake Ratio: 26 % (ref 24–39)
T4, Total: 5.7 ug/dL (ref 4.5–12.0)
TSH: 2.35 u[IU]/mL (ref 0.450–4.500)

## 2023-11-22 LAB — CBC WITH DIFFERENTIAL/PLATELET
Basophils Absolute: 0 10*3/uL (ref 0.0–0.2)
Basos: 1 %
EOS (ABSOLUTE): 0.1 10*3/uL (ref 0.0–0.4)
Eos: 2 %
Hematocrit: 36.7 % (ref 34.0–46.6)
Hemoglobin: 12 g/dL (ref 11.1–15.9)
Immature Grans (Abs): 0 10*3/uL (ref 0.0–0.1)
Immature Granulocytes: 0 %
Lymphocytes Absolute: 1.9 10*3/uL (ref 0.7–3.1)
Lymphs: 42 %
MCH: 29.7 pg (ref 26.6–33.0)
MCHC: 32.7 g/dL (ref 31.5–35.7)
MCV: 91 fL (ref 79–97)
Monocytes Absolute: 0.4 10*3/uL (ref 0.1–0.9)
Monocytes: 8 %
Neutrophils Absolute: 2.2 10*3/uL (ref 1.4–7.0)
Neutrophils: 47 %
Platelets: 235 10*3/uL (ref 150–450)
RBC: 4.04 x10E6/uL (ref 3.77–5.28)
RDW: 12.8 % (ref 11.7–15.4)
WBC: 4.6 10*3/uL (ref 3.4–10.8)

## 2023-11-22 LAB — LIPID PANEL
Chol/HDL Ratio: 2.4 ratio (ref 0.0–4.4)
Cholesterol, Total: 142 mg/dL (ref 100–199)
HDL: 58 mg/dL (ref >39)
LDL Chol Calc (NIH): 61 mg/dL (ref 0–99)
Triglycerides: 135 mg/dL (ref 0–149)
VLDL Cholesterol Cal: 23 mg/dL (ref 5–40)

## 2023-11-22 LAB — CMP14+EGFR
ALT: 12 IU/L (ref 0–32)
AST: 16 IU/L (ref 0–40)
Albumin: 4 g/dL (ref 3.7–4.7)
Alkaline Phosphatase: 72 IU/L (ref 44–121)
BUN/Creatinine Ratio: 15 (ref 12–28)
BUN: 11 mg/dL (ref 8–27)
Bilirubin Total: 0.8 mg/dL (ref 0.0–1.2)
CO2: 22 mmol/L (ref 20–29)
Calcium: 10.7 mg/dL — ABNORMAL HIGH (ref 8.7–10.3)
Chloride: 107 mmol/L — ABNORMAL HIGH (ref 96–106)
Creatinine, Ser: 0.72 mg/dL (ref 0.57–1.00)
Globulin, Total: 3.3 g/dL (ref 1.5–4.5)
Glucose: 88 mg/dL (ref 70–99)
Potassium: 4.8 mmol/L (ref 3.5–5.2)
Sodium: 142 mmol/L (ref 134–144)
Total Protein: 7.3 g/dL (ref 6.0–8.5)
eGFR: 81 mL/min/{1.73_m2} (ref >59)

## 2023-11-22 LAB — VITAMIN D 25 HYDROXY (VIT D DEFICIENCY, FRACTURES): Vit D, 25-Hydroxy: 39.6 ng/mL (ref 30.0–100.0)

## 2023-11-24 DIAGNOSIS — M85852 Other specified disorders of bone density and structure, left thigh: Secondary | ICD-10-CM | POA: Diagnosis not present

## 2023-11-24 DIAGNOSIS — Z780 Asymptomatic menopausal state: Secondary | ICD-10-CM | POA: Diagnosis not present

## 2023-12-02 ENCOUNTER — Encounter: Payer: Self-pay | Admitting: Nurse Practitioner

## 2023-12-02 ENCOUNTER — Ambulatory Visit (INDEPENDENT_AMBULATORY_CARE_PROVIDER_SITE_OTHER): Admitting: Nurse Practitioner

## 2023-12-02 VITALS — BP 136/75 | HR 63 | Temp 97.5°F | Ht 62.0 in | Wt 166.0 lb

## 2023-12-02 DIAGNOSIS — L723 Sebaceous cyst: Secondary | ICD-10-CM | POA: Diagnosis not present

## 2023-12-02 NOTE — Patient Instructions (Signed)
 Pocket of Fluid in the Skin (Epidermoid Cyst): What to Know  An epidermoid cyst is a small lump under your skin. The cyst contains a substance that is thick and oily. What are the causes? A blocked hair follicle. A hair curls and re-enters the skin instead of growing straight out of the skin. A blocked pore. Irritated skin. An injury to the skin. Certain conditions that are passed from parent to child. Human papillomavirus (HPV). This happens rarely when cysts occur on the bottom of the feet. Long-term sun damage to the skin. What increases the risk? Having acne. Being female. Having an injury to the skin. Being past puberty. Certain conditions that are passed down through family (genetic disorder). What are the signs or symptoms? The only sign of this type of cyst may be a small, painless lump under the skin. These cysts are usually painless, but they can get infected. Symptoms of infection may include: Redness. Inflammation. Tenderness. Warmth. Fever. A bad-smelling substance that drains from the cyst. Pus that drains from the cyst. How is this treated? If a cyst becomes inflamed, treatment may include: Opening and draining the cyst. Antibiotics. Shots of medicines called steroids that help lessen inflammation. Surgery to remove the cyst if it's large, painful, or could turn into cancer. Do not try to open or squeeze a cyst yourself. Follow these instructions at home: Medicines Take your medicines only as told. If you were given antibiotics, take them as told. Do not stop taking them even if you start to feel better. General instructions Keep the area around your cyst clean and dry. Wear loose, dry clothing. Avoid touching your cyst. Check your cyst every day for signs of infection. Check for: Redness, swelling, or pain. Fluid or blood. Warmth. Pus or a bad smell. Keep all follow-up visits to make sure there's no discomfort or infection. Contact a doctor if: You have  any signs of infection. Your cyst doesn't get better or gets worse. You get a cyst that looks different from other cysts you've had. You have a fever. You have redness that spreads from the cyst. This information is not intended to replace advice given to you by your health care provider. Make sure you discuss any questions you have with your health care provider. Document Revised: 02/25/2023 Document Reviewed: 02/25/2023 Elsevier Patient Education  2024 [employer].

## 2023-12-02 NOTE — Progress Notes (Signed)
 Subjective:    Patient ID: Mariah Fletcher, female    DOB: Dec 01, 1934, 89 y.o.   MRN: [id number]   Chief Complaint: sebaceous cyst  HPI  Patient has sebaceus cyst on right posterior shoulder. Patient Active Problem List   Diagnosis Date Noted   Sensorineural hearing loss (SNHL), bilateral 05/22/2021   Tinnitus, right ear 05/22/2021   Vitamin D  deficiency 02/20/2016   BMI 33.0-33.9,adult 02/24/2015   Multinodular goiter 03/26/2014   Hyperparathyroidism, primary (HCC) 02/28/2014   Osteopenia 02/20/2014   Hyperlipidemia with target LDL less than 100 02/17/2009   ESSENTIAL HYPERTENSION, BENIGN 02/17/2009   Bradycardia 02/17/2009   MURMUR 02/17/2009       Review of Systems  Constitutional:  Negative for diaphoresis.  Eyes:  Negative for pain.  Respiratory:  Negative for shortness of breath.   Cardiovascular:  Negative for chest pain, palpitations and leg swelling.  Gastrointestinal:  Negative for abdominal pain.  Endocrine: Negative for polydipsia.  Skin:  Negative for rash.  Neurological:  Negative for dizziness, weakness and headaches.  Hematological:  Does not bruise/bleed easily.  All other systems reviewed and are negative.      Objective:   Physical Exam Constitutional:      Appearance: She is obese.  Cardiovascular:     Rate and Rhythm: Normal rate and regular rhythm.     Heart sounds: Normal heart sounds.  Pulmonary:     Breath sounds: Normal breath sounds.  Neurological:     General: No focal deficit present.     Mental Status: She is alert and oriented to person, place, and time.  Psychiatric:        Mood and Affect: Mood normal.        Behavior: Behavior normal.    BP 136/75   Pulse 63   Temp (!) 97.5 F (36.4 C) (Temporal)   Ht 5\' 2"  (1.575 m)   Wt 166 lb (75.3 kg)   SpO2 97%   BMI 30.36 kg/m   I & D  Date/Time: 12/02/2023 10:59 AM  Performed by: Delfina Feller, FNP Authorized by: Gaylyn Keas Mary-Margaret, FNP   Consent:    Consent  obtained:  Verbal   Consent given by:  Patient   Risks, benefits, and alternatives were discussed: not applicable     Risks discussed:  Infection   Alternatives discussed:  Delayed treatment and no treatment Location:    Type:  Cyst   Size:  6cm   Location:  Trunk   Trunk location:  Back Pre-procedure details:    Skin preparation:  Povidone-iodine Sedation:    Sedation type:  None Anesthesia:    Anesthesia method:  Local infiltration   Local anesthetic:  Lidocaine 2% WITH epi Procedure type:    Complexity:  Simple Procedure details:    Needle aspiration: no     Incision types:  Stab incision   Incision depth:  Submucosal   Scalpel blade:  10   Wound management:  Probed and deloculated, extensive cleaning and debrided   Drainage characteristics: cottage cheey appearance.   Drainage amount:  Copious   Wound treatment:  Wound left open   Packing materials:  None Post-procedure details:    Procedure completion:  Tolerated well, no immediate complications Comments:     Pressure dressing applied         Assessment & Plan:  Toni Schnipke in today with chief complaint of removal sebaceous cyst  1. Sebaceous cyst (Primary) Keep pressure dressing on area RTO as needed  The above assessment and management plan was discussed with the patient. The patient verbalized understanding of and has agreed to the management plan. Patient is aware to call the clinic if symptoms persist or worsen. Patient is aware when to return to the clinic for a follow-up visit. Patient educated on when it is appropriate to go to the emergency department.   Mary-Margaret Gaylyn Keas, FNP

## 2023-12-08 NOTE — Progress Notes (Unsigned)
  Cardiology Office Note:   Date:  12/09/2023  ID:  Mariah Fletcher, DOB May 11, 1935, MRN [id number] PCP: Delfina Feller, FNP  [facility] Providers Cardiologist:  Eilleen Grates, MD {  History of Present Illness:   Mariah Fletcher is a 89 y.o. female  who is referred by Delfina Feller, FNP for evaluation of bradycardia. I saw bradycardia.  I saw her in 2023 because of this.  She had some heart rates in the 40s but she had no symptoms.  She is just back for return follow-up and she has still no symptoms.  In particular she is not having any presyncope or syncope.  She has no palpitations.  She does her own shopping.  She has 5 stairs up to her apartment.  She lives independently.  She denies any new cardiovascular symptoms.  She has had no chest pressure, neck or arm discomfort.  She has no new shortness of breath, PND or orthopnea.  ROS: As stated in the HPI and negative for all other systems.  Studies Reviewed:    EKG:   EKG Interpretation Date/Time:  Friday Dec 09 2023 11:06:18 EDT Ventricular Rate:  53 PR Interval:  190 QRS Duration:  90 QT Interval:  352 QTC Calculation: 330 R Axis:   -28  Text Interpretation: Sinus bradycardia Minimal voltage criteria for LVH, may be normal variant ( R in aVL ) Leftward axis, No change from 2023 EKG. Confirmed by Eilleen Grates ([id number]) on 12/09/2023 11:36:16 AM     Risk Assessment/Calculations:              Physical Exam:   VS:  BP 132/60   Pulse (!) 54   Ht 5\' 2"  (1.575 m)   Wt 160 lb (72.6 kg)   SpO2 94%   BMI 29.26 kg/m    Wt Readings from Last 3 Encounters:  12/09/23 160 lb (72.6 kg)  12/02/23 166 lb (75.3 kg)  11/21/23 163 lb (73.9 kg)     GEN: Well nourished, well developed in no acute distress NECK: No JVD; No carotid bruits CARDIAC: RRR, soft brief apical systolic murmur radiating slightly at the aortic outflow tract, no diastolic murmurs, rubs, gallops RESPIRATORY:  Clear to auscultation without rales,  wheezing or rhonchi  ABDOMEN: Soft, non-tender, non-distended EXTREMITIES:  No edema; No deformity   ASSESSMENT AND PLAN:   Bradycardia: She has no symptoms related to this.  No change in therapy.  There is no indication for pacing.  She will let me know if she ever has lightheadedness or other symptoms and we did discuss these.   HTN: Her blood pressure is at target.  No change in therapy.   MURMUR:   She has an early peaking soft systolic murmur and probably has some aortic sclerosis or mild stenosis but no symptoms.  It is not changed previous descriptions.  No further imaging.        Follow up with me as needed  Signed, Eilleen Grates, MD

## 2023-12-09 ENCOUNTER — Encounter: Payer: Self-pay | Admitting: Cardiology

## 2023-12-09 ENCOUNTER — Ambulatory Visit (INDEPENDENT_AMBULATORY_CARE_PROVIDER_SITE_OTHER): Admitting: Cardiology

## 2023-12-09 VITALS — BP 132/60 | HR 54 | Ht 62.0 in | Wt 160.0 lb

## 2023-12-09 DIAGNOSIS — E785 Hyperlipidemia, unspecified: Secondary | ICD-10-CM

## 2023-12-09 DIAGNOSIS — R001 Bradycardia, unspecified: Secondary | ICD-10-CM

## 2023-12-09 DIAGNOSIS — I10 Essential (primary) hypertension: Secondary | ICD-10-CM

## 2023-12-09 DIAGNOSIS — R011 Cardiac murmur, unspecified: Secondary | ICD-10-CM | POA: Diagnosis not present

## 2023-12-09 NOTE — Patient Instructions (Addendum)

## 2024-05-22 ENCOUNTER — Encounter: Payer: Self-pay | Admitting: Nurse Practitioner

## 2024-05-22 ENCOUNTER — Ambulatory Visit: Admitting: Nurse Practitioner

## 2024-05-22 VITALS — BP 132/72 | HR 47 | Temp 97.3°F | Ht 62.0 in | Wt 163.0 lb

## 2024-05-22 DIAGNOSIS — E785 Hyperlipidemia, unspecified: Secondary | ICD-10-CM | POA: Diagnosis not present

## 2024-05-22 DIAGNOSIS — M8588 Other specified disorders of bone density and structure, other site: Secondary | ICD-10-CM

## 2024-05-22 DIAGNOSIS — I10 Essential (primary) hypertension: Secondary | ICD-10-CM | POA: Diagnosis not present

## 2024-05-22 DIAGNOSIS — Z6833 Body mass index (BMI) 33.0-33.9, adult: Secondary | ICD-10-CM | POA: Diagnosis not present

## 2024-05-22 DIAGNOSIS — E559 Vitamin D deficiency, unspecified: Secondary | ICD-10-CM | POA: Diagnosis not present

## 2024-05-22 DIAGNOSIS — R001 Bradycardia, unspecified: Secondary | ICD-10-CM | POA: Diagnosis not present

## 2024-05-22 DIAGNOSIS — E210 Primary hyperparathyroidism: Secondary | ICD-10-CM

## 2024-05-22 MED ORDER — SIMVASTATIN 20 MG PO TABS: 20.0000 mg | ORAL_TABLET | Freq: Every day | ORAL | 1 refills | Status: AC

## 2024-05-22 MED ORDER — AMLODIPINE BESYLATE 2.5 MG PO TABS: 2.5000 mg | ORAL_TABLET | Freq: Every day | ORAL | 1 refills | Status: AC

## 2024-05-22 MED ORDER — LISINOPRIL 20 MG PO TABS: 20.0000 mg | ORAL_TABLET | Freq: Every day | ORAL | 1 refills | Status: AC

## 2024-05-22 NOTE — Progress Notes (Signed)
 Subjective:    Patient ID: Mariah Fletcher, female    DOB: [birth year], 89 y.o.   MRN: [id number]   Chief Complaint: medical management of chronic issues     HPI:  Mariah Fletcher is a 89 y.o. who identifies as a female who was assigned female at birth.   Social history: Lives with: by herself Work history: retired   Water Engineer in today for follow up of the following chronic medical issues:  1. ESSENTIAL HYPERTENSION, BENIGN No c/o chest pain, sob or headache. Does not check blood pressure at home. BP Readings from Last 3 Encounters:  12/09/23 132/60  12/02/23 136/75  11/21/23 139/76    2. Hyperparathyroidism, primary (HCC) No issues that aware of Lab Results  Component Value Date   PTH 84 (H) 05/20/2023   PTH Comment 05/20/2023   CALCIUM 10.7 (H) 11/21/2023     3. Hyperlipidemia with target LDL less than 100 Does try to watch diet but does no dedicated exercise. She does try to stay as active as she can Lab Results  Component Value Date   CHOL 142 11/21/2023   HDL 58 11/21/2023   LDLCALC 61 11/21/2023   TRIG 135 11/21/2023   CHOLHDL 2.4 11/21/2023    4. Vitamin D  deficiency Daily vitamin d  supplement  5. Bradycardia No syncopal episodes or near syncopal episodes  6. Osteopenia of lumbar spine Does no weight bearing exercises. Last dexascan was done on 07/16/21. Time to repeat, but does not want to do today 7. BMI 33.0-33.9,adult Weight is up 3 lbs  Wt Readings from Last 3 Encounters:  05/22/24 163 lb (73.9 kg)  12/09/23 160 lb (72.6 kg)  12/02/23 166 lb (75.3 kg)   BMI Readings from Last 3 Encounters:  05/22/24 29.81 kg/m  12/09/23 29.26 kg/m  12/02/23 30.36 kg/m       New complaints: None  today  No Known Allergies Outpatient Encounter Medications as of 05/22/2024  Medication Sig   amLODipine  (NORVASC ) 2.5 MG tablet Take 1 tablet (2.5 mg total) by mouth daily.   aspirin 81 MG tablet Take 81 mg by mouth daily.   cholecalciferol (VITAMIN D ) 1000  UNITS tablet Take 1 tablet (1,000 Units total) by mouth daily.   lisinopril  (ZESTRIL ) 20 MG tablet Take 1 tablet (20 mg total) by mouth daily.   simvastatin  (ZOCOR ) 20 MG tablet Take 1 tablet (20 mg total) by mouth daily at 6 PM. (Needs to be seen before next refill)   triamcinolone  cream (KENALOG ) 0.1 % Apply 1 Application topically 2 (two) times daily.   No facility-administered encounter medications on file as of 05/22/2024.    Past Surgical History:  Procedure Laterality Date   ABDOMINAL HYSTERECTOMY  07/26/2005   BREAST BIOPSY     unsure of side date or location    Family History  Problem Relation Age of Onset   Hyperparathyroidism Other        neg fam hx   Hypertension Mother    Arthritis Mother    Heart disease Mother    Alcohol abuse Father    Hypertension Sister    Arthritis Sister    Hypertension Brother    Arthritis Brother    Hypertension Sister    Arthritis Sister       Controlled substance contract: n/a     Review of Systems  Constitutional:  Negative for diaphoresis.  Eyes:  Negative for pain.  Respiratory:  Negative for shortness of breath.   Cardiovascular:  Negative for chest pain,  palpitations and leg swelling.  Gastrointestinal:  Negative for abdominal pain.  Endocrine: Negative for polydipsia.  Skin:  Negative for rash.  Neurological:  Negative for dizziness, weakness and headaches.  Hematological:  Does not bruise/bleed easily.  All other systems reviewed and are negative.      Objective:   Physical Exam Vitals and nursing note reviewed.  Constitutional:      General: She is not in acute distress.    Appearance: Normal appearance. She is well-developed.  HENT:     Head: Normocephalic.     Right Ear: Tympanic membrane normal.     Left Ear: Tympanic membrane normal.     Nose: Nose normal.     Mouth/Throat:     Mouth: Mucous membranes are moist.  Eyes:     Pupils: Pupils are equal, round, and reactive to light.  Neck:      Vascular: No carotid bruit or JVD.  Cardiovascular:     Rate and Rhythm: Normal rate and regular rhythm.     Heart sounds: Murmur (2/6 systolic) heard.  Pulmonary:     Effort: Pulmonary effort is normal. No respiratory distress.     Breath sounds: Normal breath sounds. No wheezing or rales.  Chest:     Chest wall: No tenderness.  Abdominal:     General: Bowel sounds are normal. There is no distension or abdominal bruit.     Palpations: Abdomen is soft. There is no hepatomegaly, splenomegaly, mass or pulsatile mass.     Tenderness: There is no abdominal tenderness.  Musculoskeletal:        General: Normal range of motion.     Cervical back: Normal range of motion and neck supple.     Right lower leg: Edema (mild) present.     Left lower leg: Edema (mild) present.  Lymphadenopathy:     Cervical: No cervical adenopathy.  Skin:    General: Skin is warm and dry.     Comments: 3cm cyst on right shoulder blade  Neurological:     Mental Status: She is alert and oriented to person, place, and time.     Deep Tendon Reflexes: Reflexes are normal and symmetric.  Psychiatric:        Behavior: Behavior normal.        Thought Content: Thought content normal.        Judgment: Judgment normal.    BP 132/72   Pulse (!) 47   Temp (!) 97.3 F (36.3 C) (Temporal)   Ht 5' 2 (1.575 m)   Wt 163 lb (73.9 kg)   SpO2 99%   BMI 29.81 kg/m          Assessment & Plan:  Mariah Fletcher comes in today with chief complaint of medical management of chronic issues    Diagnosis and orders addressed:  1. ESSENTIAL HYPERTENSION, BENIGN Low sodium diet - amLODipine  (NORVASC ) 2.5 MG tablet; Take 1 tablet (2.5 mg total) by mouth daily.  Dispense: 90 tablet; Refill: 1 - lisinopril  (ZESTRIL ) 20 MG tablet; Take 1 tablet (20 mg total) by mouth daily.  Dispense: 90 tablet; Refill: 1 - CBC with Differential/Platelet - CMP14+EGFR  2. Hyperparathyroidism, primary (HCC) Labs pending - PTH, Intact and  Calcium  3. Hyperlipidemia with target LDL less than 100 Low fat diet - simvastatin  (ZOCOR ) 20 MG tablet; Take 1 tablet (20 mg total) by mouth daily at 6 PM. (Needs to be seen before next refill)  Dispense: 90 tablet; Refill: 1 - Lipid panel  4. Vitamin D  deficiency Continue daily vitamin d  supplement  5. Bradycardia  6. Osteopenia of lumbar spine Bone density to be repeated today  7. BMI 33.0-33.9,adult Discussed diet and exercise for person with BMI >25 Will recheck weight in 3-6 months    Labs pending Health Maintenance reviewed Diet and exercise encouraged  Follow up plan: 6 months   Mary-Margaret Gladis, FNP

## 2024-05-22 NOTE — Patient Instructions (Signed)
 Hypercalcemia Hypercalcemia is when the level of calcium  in a person's blood is above normal. The body needs calcium  to make bones and keep them strong. Calcium  also helps the muscles, nerves, brain, and heart work the way they should. Most of the calcium  in the body is stored in the bones. There is also calcium  in the blood. Hypercalcemia occurs when there is too much calcium  in your blood. Calcium  levels in the blood are regulated by hormones, kidneys, and the gastrointestinal tract.  Hypercalcemia can happen when calcium  comes out of the bones, or when the kidneys are not able to remove calcium  from the blood. Hypercalcemia can be mild or severe. What are the causes? There are many possible causes of hypercalcemia. Common causes of this condition include: Hyperparathyroidism. This is a condition in which the body produces too much parathyroid hormone. There are four parathyroid glands in your neck. These glands produce a chemical messenger (hormone) that helps the body absorb calcium  from foods and helps your bones release calcium . Certain kinds of cancer. Less common causes of hypercalcemia include: Calcium  and vitamin D  dietary supplements. Chronic kidney disease. Hyperthyroidism. Severe dehydration. Being on bed rest or being inactive for a long time. Certain medicines. Infections. What increases the risk? You are more likely to develop this condition if: You are female. You are 73 years of age or older. You have a family history of hypercalcemia. What are the signs or symptoms? Mild hypercalcemia that starts slowly may not cause symptoms. Severe, sudden hypercalcemia is more likely to cause symptoms, such as: Being more thirsty than usual. Needing to urinate more often than usual. Abdominal pain. Nausea and vomiting. Constipation. Muscle pain, twitching, or weakness. Feeling very tired. How is this diagnosed?  Hypercalcemia is usually diagnosed with a blood test. You may also  have tests to help check what is causing this condition. Tests include imaging tests and more blood tests. How is this treated? Treatment for hypercalcemia depends on the cause. Treatment may include: Receiving fluids through an IV. Medicines. These can be used to: Keep calcium  levels steady after receiving fluids (loop diuretics). Keep calcium  in your bones (bisphosphonates). Lower the calcium  level in your blood. Surgery to remove overactive parathyroid glands. A procedure that filters your blood to correct calcium  levels (hemodialysis). Follow these instructions at home:  Take over-the-counter and prescription medicines only as told by your health care provider. Follow instructions from your health care provider about eating or drinking restrictions. Drink enough fluid to keep your urine pale yellow. Stay active. Weight-bearing exercise helps to keep calcium  in your bones. Follow instructions from your health care provider about what type and level of exercise is safe for you. Keep all follow-up visits. This is important. Contact a health care provider if: You have a fever. Your heartbeat is irregular or very fast. You have changes in mood, memory, or personality. Get help right away if: You have severe abdominal pain. You have chest pain. You have trouble breathing. You become very confused and sleepy. You lose consciousness. These symptoms may represent a serious problem that is an emergency. Do not wait to see if the symptoms will go away. Get medical help right away. Call your local emergency services (911 in the [place] Do not drive yourself to the hospital. Summary Hypercalcemia is when the level of calcium  in a person's blood is above normal. The body needs calcium  to make bones and keep them strong. There are many possible causes of hypercalcemia, and treatment depends on  the cause. Take over-the-counter and prescription medicines only as told by your health care  provider. This information is not intended to replace advice given to you by your health care provider. Make sure you discuss any questions you have with your health care provider. Document Revised: 12/17/2020 Document Reviewed: 12/17/2020 Elsevier Patient Education  2024 [employer].

## 2024-05-23 LAB — CBC WITH DIFFERENTIAL/PLATELET
Basophils Absolute: 0.1 x10E3/uL (ref 0.0–0.2)
Basos: 2 %
EOS (ABSOLUTE): 0.1 x10E3/uL (ref 0.0–0.4)
Eos: 2 %
Hematocrit: 36.2 % (ref 34.0–46.6)
Hemoglobin: 11.8 g/dL (ref 11.1–15.9)
Immature Grans (Abs): 0 x10E3/uL (ref 0.0–0.1)
Immature Granulocytes: 0 %
Lymphocytes Absolute: 1.8 x10E3/uL (ref 0.7–3.1)
Lymphs: 39 %
MCH: 29.8 pg (ref 26.6–33.0)
MCHC: 32.6 g/dL (ref 31.5–35.7)
MCV: 91 fL (ref 79–97)
Monocytes Absolute: 0.4 x10E3/uL (ref 0.1–0.9)
Monocytes: 9 %
Neutrophils Absolute: 2.3 x10E3/uL (ref 1.4–7.0)
Neutrophils: 48 %
Platelets: 188 x10E3/uL (ref 150–450)
RBC: 3.96 x10E6/uL (ref 3.77–5.28)
RDW: 13.1 % (ref 11.7–15.4)
WBC: 4.6 x10E3/uL (ref 3.4–10.8)

## 2024-05-23 LAB — LIPID PANEL
Chol/HDL Ratio: 2.2 ratio (ref 0.0–4.4)
Cholesterol, Total: 123 mg/dL (ref 100–199)
HDL: 56 mg/dL (ref >39)
LDL Chol Calc (NIH): 48 mg/dL (ref 0–99)
Triglycerides: 104 mg/dL (ref 0–149)
VLDL Cholesterol Cal: 19 mg/dL (ref 5–40)

## 2024-05-23 LAB — CMP14+EGFR
ALT: 9 IU/L (ref 0–32)
AST: 14 IU/L (ref 0–40)
Albumin: 3.9 g/dL (ref 3.7–4.7)
Alkaline Phosphatase: 58 IU/L (ref 48–129)
BUN/Creatinine Ratio: 20 (ref 12–28)
BUN: 13 mg/dL (ref 8–27)
Bilirubin Total: 0.8 mg/dL (ref 0.0–1.2)
CO2: 24 mmol/L (ref 20–29)
Calcium: 10.5 mg/dL — ABNORMAL HIGH (ref 8.7–10.3)
Chloride: 106 mmol/L (ref 96–106)
Creatinine, Ser: 0.64 mg/dL (ref 0.57–1.00)
Globulin, Total: 2.8 g/dL (ref 1.5–4.5)
Glucose: 85 mg/dL (ref 70–99)
Potassium: 4.2 mmol/L (ref 3.5–5.2)
Sodium: 141 mmol/L (ref 134–144)
Total Protein: 6.7 g/dL (ref 6.0–8.5)
eGFR: 85 mL/min/1.73 (ref >59)

## 2024-05-23 LAB — PTH, INTACT AND CALCIUM: PTH: 77 pg/mL — AB (ref 15–65)

## 2024-05-24 ENCOUNTER — Ambulatory Visit: Payer: Self-pay | Admitting: Nurse Practitioner

## 2024-05-30 ENCOUNTER — Ambulatory Visit: Payer: Medicare PPO

## 2024-05-30 VITALS — BP 132/72 | HR 47 | Ht 62.0 in | Wt 163.0 lb

## 2024-05-30 DIAGNOSIS — Z0000 Encounter for general adult medical examination without abnormal findings: Secondary | ICD-10-CM | POA: Diagnosis not present

## 2024-05-30 NOTE — Progress Notes (Cosign Needed Addendum)
 Subjective:   Mariah Fletcher is a 89 y.o. female who presents for a Medicare Annual Wellness Visit.  I connected with  Sharyne Rung on 05/30/24 by a audio enabled telemedicine application and verified that I am speaking with the correct person using two identifiers.  Patient Location: Home  Provider Location: Office/Clinic  I discussed the limitations of evaluation and management by telemedicine. The patient expressed understanding and agreed to proceed.   Allergies (verified) Patient has no known allergies.   History: Past Medical History:  Diagnosis Date   Allergy    Hyperlipidemia    Hypertension    Past Surgical History:  Procedure Laterality Date   ABDOMINAL HYSTERECTOMY  07/26/2005   BREAST BIOPSY     unsure of side date or location   Family History  Problem Relation Age of Onset   Hyperparathyroidism Other        neg fam hx   Hypertension Mother    Arthritis Mother    Heart disease Mother    Alcohol abuse Father    Hypertension Sister    Arthritis Sister    Hypertension Brother    Arthritis Brother    Hypertension Sister    Arthritis Sister    Social History   Occupational History   Occupation: retired  Tobacco Use   Smoking status: Never   Smokeless tobacco: Never  Vaping Use   Vaping status: Never Used  Substance and Sexual Activity   Alcohol use: Yes    Comment: Rarely drinks wine    Drug use: Never   Sexual activity: Not Currently   Tobacco Counseling Counseling given: Yes  SDOH Screenings   Food Insecurity: No Food Insecurity (05/30/2024)  Housing: Unknown (05/30/2024)  Transportation Needs: No Transportation Needs (05/30/2024)  Utilities: Not At Risk (05/30/2024)  Alcohol Screen: Low Risk  (05/30/2023)  Depression (PHQ2-9): Low Risk  (05/30/2024)  Financial Resource Strain: Low Risk  (05/30/2023)  Physical Activity: Insufficiently Active (05/30/2024)  Social Connections: Moderately Isolated (05/30/2024)  Stress: No Stress Concern Present  (05/30/2024)  Tobacco Use: Low Risk  (05/30/2024)  Health Literacy: Adequate Health Literacy (05/30/2024)   Depression Screen    05/30/2024    1:57 PM 05/22/2024    9:59 AM 12/02/2023   10:25 AM 11/21/2023   10:01 AM 05/30/2023    2:39 PM 05/20/2023   11:21 AM 10/19/2022   10:04 AM  PHQ 2/9 Scores  PHQ - 2 Score 0 0 0 0 0 0 0  PHQ- 9 Score     0 1 1     Goals Addressed             This Visit's Progress    DIET - REDUCE SUGAR INTAKE   On track    Reduce intake of baked goods.        Visit info / Clinical Intake: Medicare Wellness Visit Type:: Subsequent Annual Wellness Visit Medicare Wellness Visit Mode:: Telephone If telephone:: video declined If telephone or video:: vitals recorded from last visit Interpreter Needed?: No Pre-visit prep was completed: yes AWV questionnaire completed by patient prior to visit?: no Living arrangements:: (!) lives alone Patient's Overall Health Status Rating: very good Typical amount of pain: some (art bothering her sometimes) Does pain affect daily life?: no Are you currently prescribed opioids?: no  Dietary Habits and Nutritional Risks How many meals a day?: 2 Eats fruit and vegetables daily?: yes Most meals are obtained by: preparing own meals Diabetic:: no  Functional Status Activities of Daily Living (to  include ambulation/medication): Independent Ambulation: Independent Medication Administration: Independent Home Management: Independent Manage your own finances?: yes Primary transportation is: facility / other (public transportation) Concerns about hearing?: no  Fall Screening Falls in the past year?: 0 Number of falls in past year: 0 Was there an injury with Fall?: 0 Fall Risk Category Calculator: 0 Patient Fall Risk Level: Low Fall Risk  Fall Risk Patient at Risk for Falls Due to: No Fall Risks Fall risk Follow up: Falls evaluation completed; Education provided  Home and Transportation Safety: All rugs have non-skid  backing?: yes All stairs or steps have railings?: yes Grab bars in the bathtub or shower?: yes Have non-skid surface in bathtub or shower?: yes Good home lighting?: yes Regular seat belt use?: yes Hospital stays in the last year:: no  Cognitive Assessment Difficulty concentrating, remembering, or making decisions? : no Will 6CIT or Mini Cog be Completed: yes What year is it?: 0 points What month is it?: 0 points Give patient an address phrase to remember (5 components): [address] [place], [place] About what time is it?: 0 points Count backwards from 20 to 1: 0 points Say the months of the year in reverse: 0 points Repeat the address phrase from earlier: 0 points 6 CIT Score: 0 points  Advance Directives (For Healthcare) Does Patient Have a Medical Advance Directive?: No Would patient like information on creating a medical advance directive?: No - Patient declined        Objective:    Today's Vitals   05/30/24 1348  BP: 132/72  Pulse: (!) 47  Weight: 163 lb (73.9 kg)  Height: 5' 2 (1.575 m)   Body mass index is 29.81 kg/m.  Current Medications (verified) Outpatient Encounter Medications as of 05/30/2024  Medication Sig   amLODipine  (NORVASC ) 2.5 MG tablet Take 1 tablet (2.5 mg total) by mouth daily.   aspirin 81 MG tablet Take 81 mg by mouth daily.   cholecalciferol (VITAMIN D ) 1000 UNITS tablet Take 1 tablet (1,000 Units total) by mouth daily.   lisinopril  (ZESTRIL ) 20 MG tablet Take 1 tablet (20 mg total) by mouth daily.   simvastatin  (ZOCOR ) 20 MG tablet Take 1 tablet (20 mg total) by mouth daily at 6 PM. (Needs to be seen before next refill)   triamcinolone  cream (KENALOG ) 0.1 % Apply 1 Application topically 2 (two) times daily.   No facility-administered encounter medications on file as of 05/30/2024.   Hearing/Vision screen Hearing Screening - Comments:: Pt denies hearing  Vision Screening - Comments:: Pt wear glasses just for reading/last ov 2024 Immunizations and  Health Maintenance Health Maintenance  Topic Date Due   COVID-19 Vaccine (3 - 2025-26 season) 06/07/2024 (Originally 03/26/2024)   Zoster Vaccines- Shingrix (1 of 2) 08/22/2024 (Originally [birth year]/11/1987)   DTaP/Tdap/Td (1 - Tdap) 11/20/2024 (Originally 02/11/1954)   Medicare Annual Wellness (AWV)  05/30/2025   DEXA SCAN  11/23/2025   Pneumococcal Vaccine: 50+ Years  Completed   Influenza Vaccine  Completed   Meningococcal B Vaccine  Aged Out   Mammogram  Discontinued        Assessment/Plan:  This is a routine wellness examination for Mariah Fletcher.  Patient Care Team: Gladis Mustard, FNP as PCP - General (Nurse Practitioner) Lavona Agent, MD as PCP - Cardiology (Cardiology)  I have personally reviewed and noted the following in the patient's chart:   Medical and social history Use of alcohol, tobacco or illicit drugs  Current medications and supplements including opioid prescriptions. Functional ability and status Nutritional  status Physical activity Advanced directives List of other physicians Hospitalizations, surgeries, and ER visits in previous 12 months Vitals Screenings to include cognitive, depression, and falls Referrals and appointments  No orders of the defined types were placed in this encounter.  In addition, I have reviewed and discussed with patient certain preventive protocols, quality metrics, and best practice recommendations. A written personalized care plan for preventive services as well as general preventive health recommendations were provided to patient.   Mariah Fletcher, CMA   05/30/2024   Return in 1 year (on 05/30/2025).  After Visit Summary: (MyChart) Due to this being a telephonic visit, the after visit summary with patients personalized plan was offered to patient via MyChart   Nurse Notes: n/a  I have reviewed and agree with the above AWV documentation.   Mary-Margaret Gladis, FNP

## 2024-11-19 ENCOUNTER — Ambulatory Visit: Payer: Self-pay | Admitting: Nurse Practitioner

## 2025-06-03 ENCOUNTER — Ambulatory Visit
